# Patient Record
Sex: Female | Born: 1980 | Race: White | Hispanic: No | Marital: Single | Smoking: Former smoker
Health system: Southern US, Community
[De-identification: ages and names within clinical notes are randomized; demographics above are authoritative.]

## PROBLEM LIST (undated history)

## (undated) DIAGNOSIS — I10 Essential (primary) hypertension: Secondary | ICD-10-CM

## (undated) HISTORY — PX: TONSILLECTOMY: SUR1361

---

## 2005-02-19 ENCOUNTER — Emergency Department: Payer: Self-pay | Admitting: Emergency Medicine

## 2005-08-22 ENCOUNTER — Emergency Department: Payer: Self-pay | Admitting: Emergency Medicine

## 2005-09-11 ENCOUNTER — Emergency Department: Payer: Self-pay | Admitting: General Practice

## 2007-05-01 ENCOUNTER — Emergency Department: Payer: Self-pay | Admitting: Emergency Medicine

## 2007-09-11 ENCOUNTER — Emergency Department: Payer: Self-pay | Admitting: Emergency Medicine

## 2007-12-16 ENCOUNTER — Emergency Department: Payer: Self-pay | Admitting: Emergency Medicine

## 2008-11-19 ENCOUNTER — Emergency Department: Payer: Self-pay | Admitting: Emergency Medicine

## 2009-05-19 ENCOUNTER — Emergency Department: Payer: Self-pay

## 2009-06-23 ENCOUNTER — Encounter: Payer: Self-pay | Admitting: Maternal & Fetal Medicine

## 2009-07-07 ENCOUNTER — Encounter: Payer: Self-pay | Admitting: Maternal and Fetal Medicine

## 2009-07-21 ENCOUNTER — Encounter: Payer: Self-pay | Admitting: Obstetrics and Gynecology

## 2009-07-31 ENCOUNTER — Encounter: Payer: Self-pay | Admitting: Maternal & Fetal Medicine

## 2009-08-11 ENCOUNTER — Encounter: Payer: Self-pay | Admitting: Obstetrics and Gynecology

## 2009-08-21 ENCOUNTER — Encounter: Payer: Self-pay | Admitting: Obstetrics & Gynecology

## 2009-08-28 ENCOUNTER — Encounter: Payer: Self-pay | Admitting: Obstetrics and Gynecology

## 2009-08-31 ENCOUNTER — Encounter: Payer: Self-pay | Admitting: Obstetrics and Gynecology

## 2009-09-04 ENCOUNTER — Encounter: Payer: Self-pay | Admitting: Maternal & Fetal Medicine

## 2009-09-15 ENCOUNTER — Inpatient Hospital Stay: Payer: Self-pay

## 2009-11-07 ENCOUNTER — Ambulatory Visit: Payer: Self-pay

## 2009-11-13 ENCOUNTER — Ambulatory Visit: Payer: Self-pay

## 2010-03-25 IMAGING — US US OB DETAIL+14 WK - NRPT [facility]
1 series · 14 of 28 positions shown · non-contrast
Comparison: none

[Series 1: us ob detail+14 wk - nrpt [facility] · 14 of 68 slices shown]
[im 3/68]
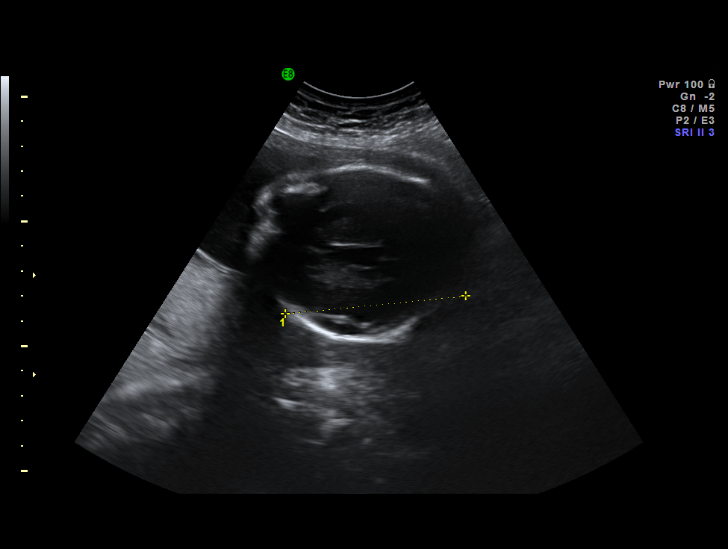
[im 8/68]
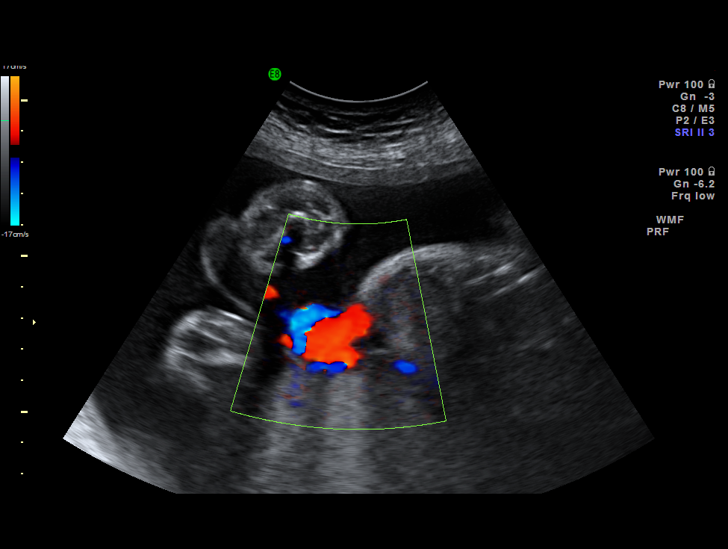
[im 13/68]
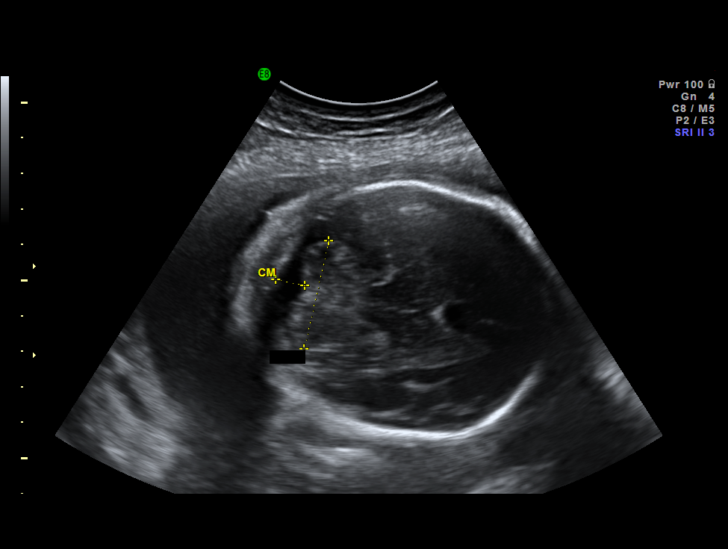
[im 18/68]
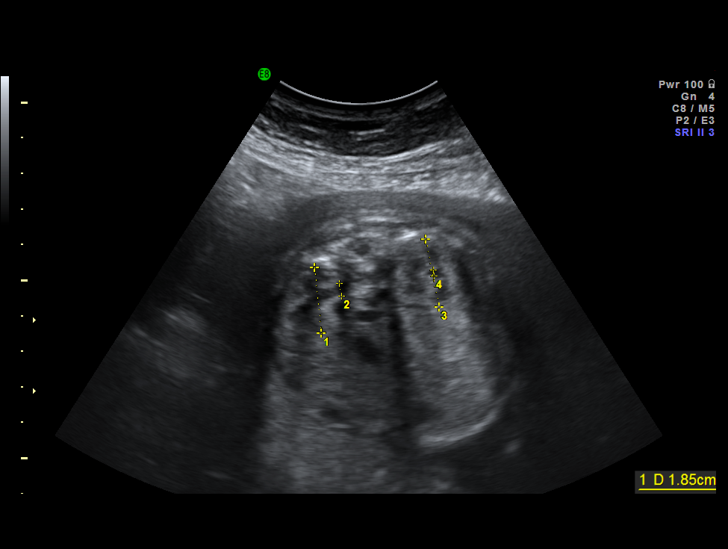
[im 23/68]
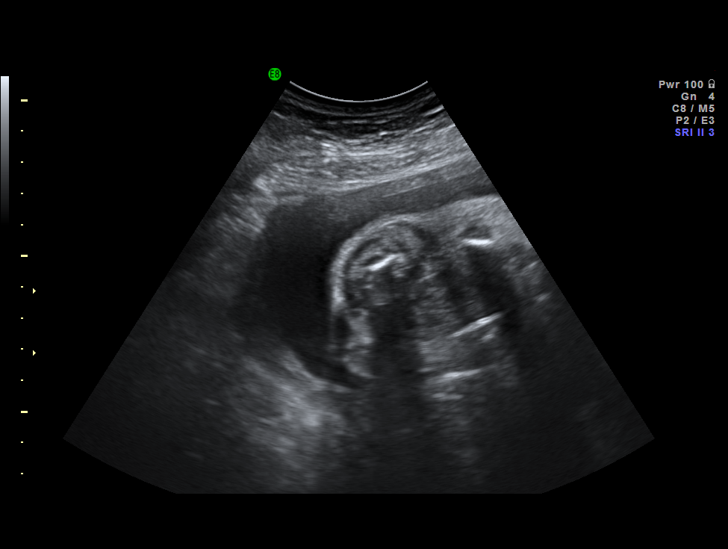
[im 28/68]
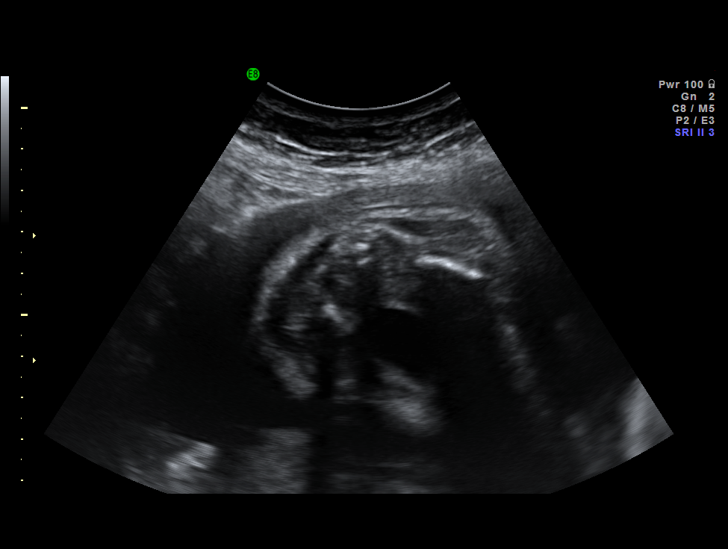
[im 33/68]
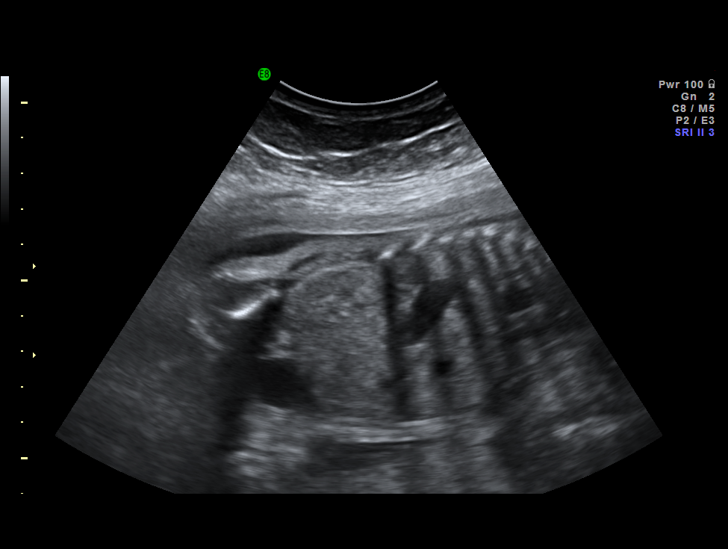
[im 38/68]
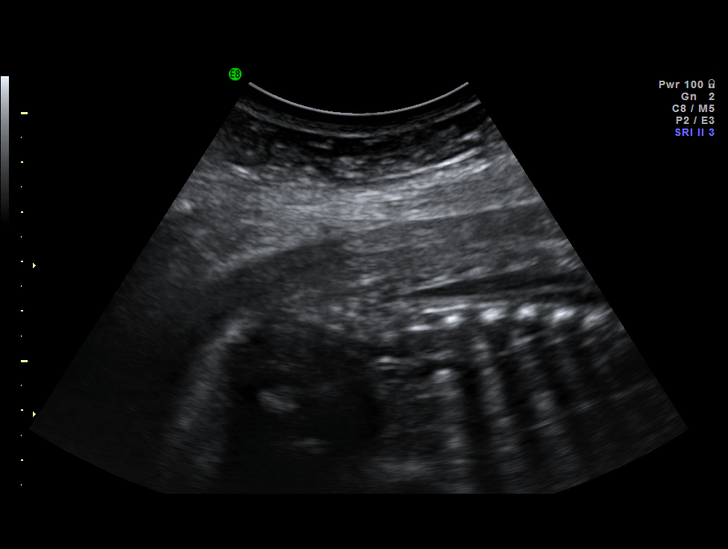
[im 43/68]
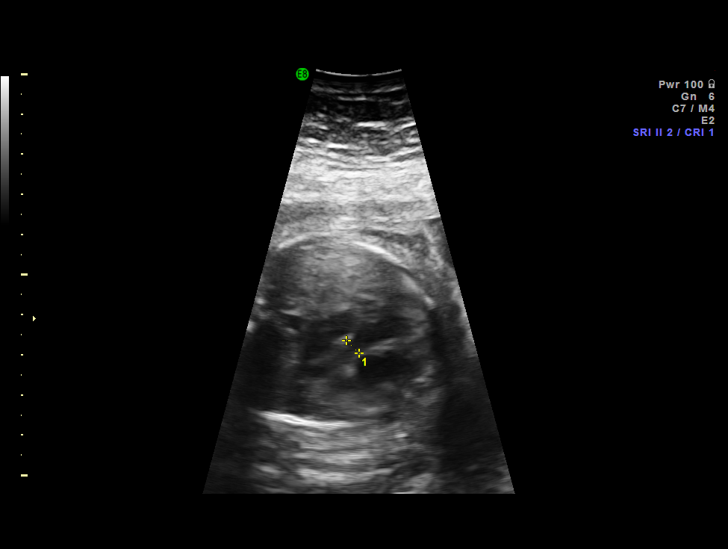
[im 48/68]
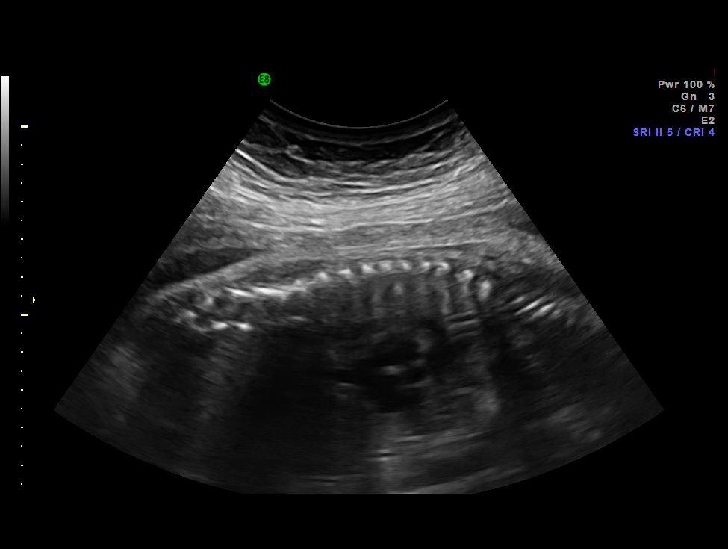
[im 53/68]
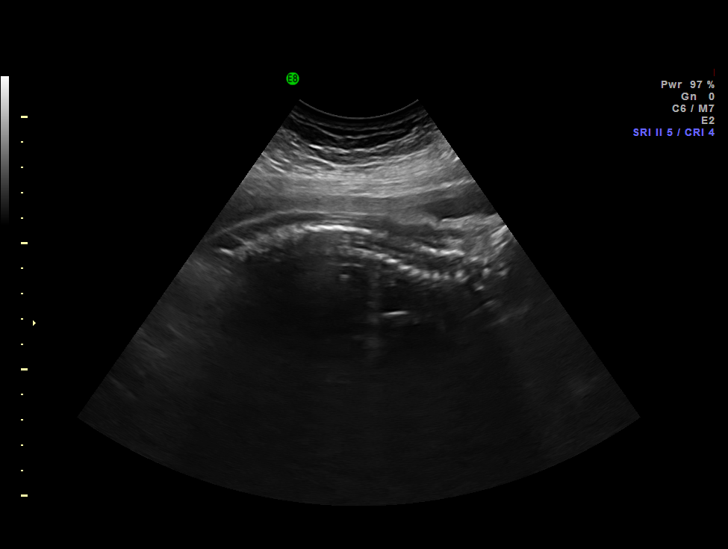
[im 58/68]
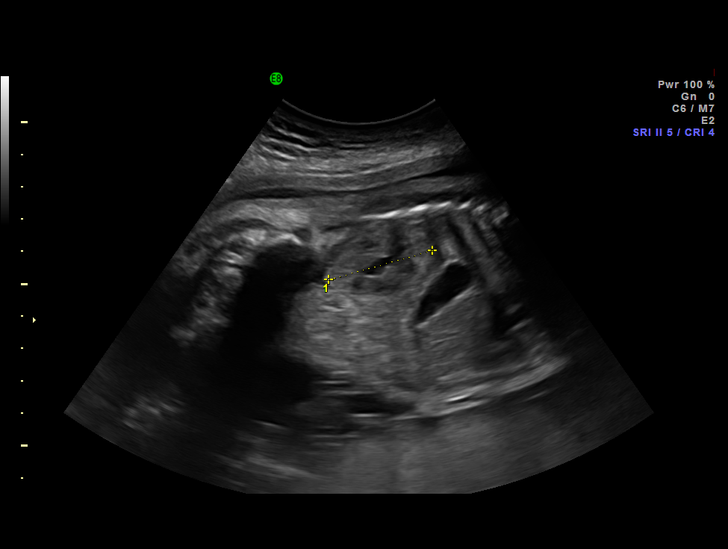
[im 63/68]
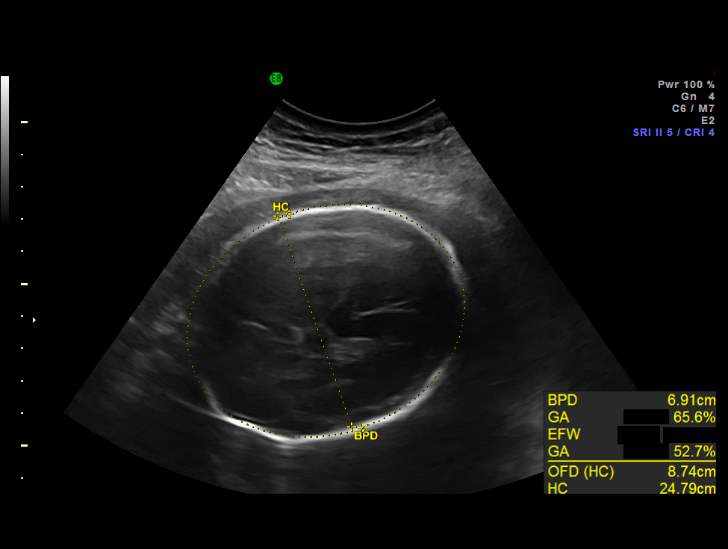
[im 68/68]
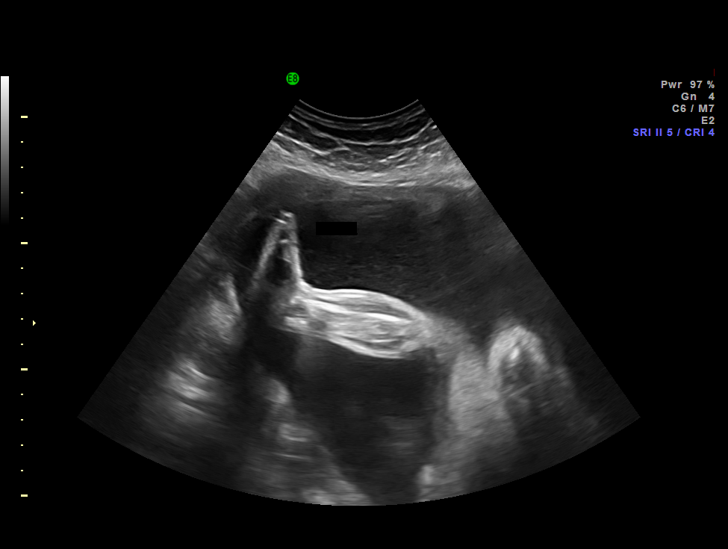

[14 of 28 positions shown; findings below may reference images not displayed]

IMAGES IMPORTED FROM THE [employer] WORKFLOW SYSTEM
NO DICTATION FOR STUDY

## 2010-04-08 IMAGING — US ULTRAOUND OB LIMITED - [facility]PT [facility]
1 series · 11 of 11 positions shown · non-contrast
Comparison: none

[Series 1: ultraound ob limited - nrpt [facility] · 11 of 11 slices shown]
[im 1/11]
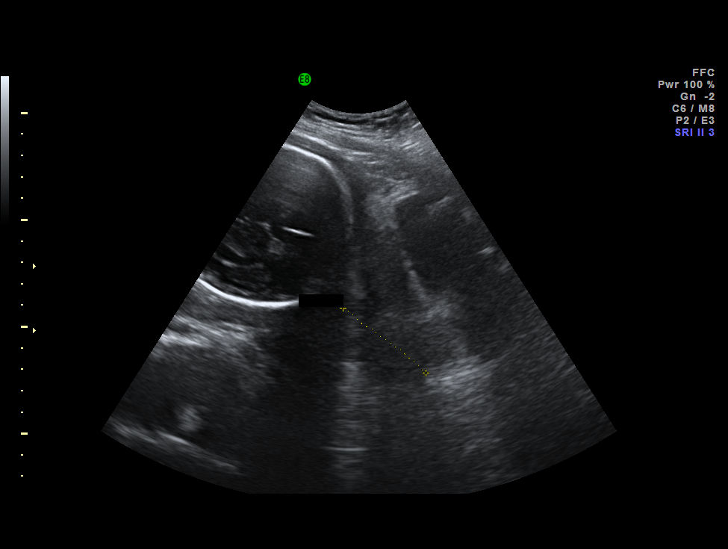
[im 2/11]
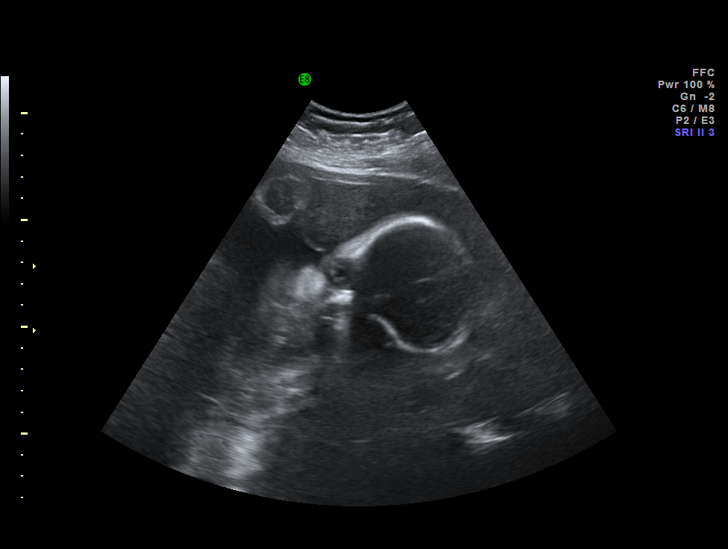
[im 3/11]
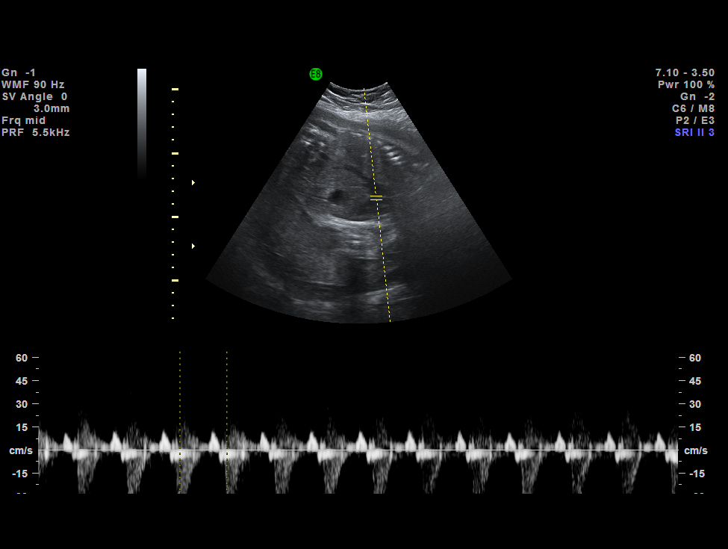
[im 4/11]
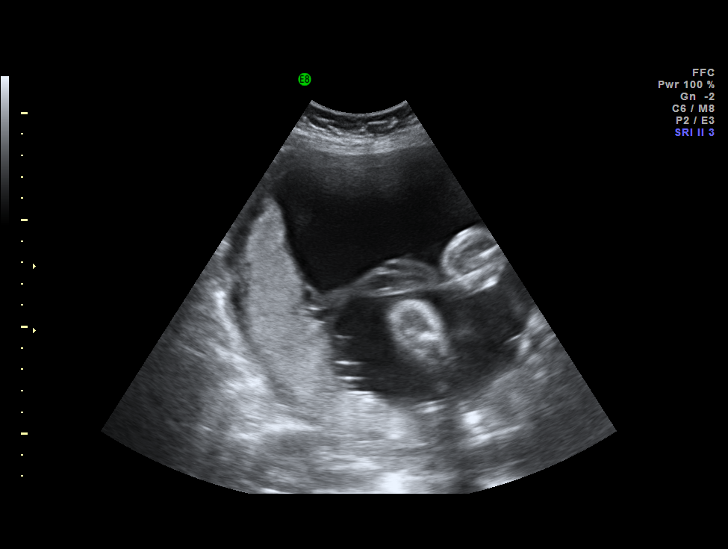
[im 5/11]
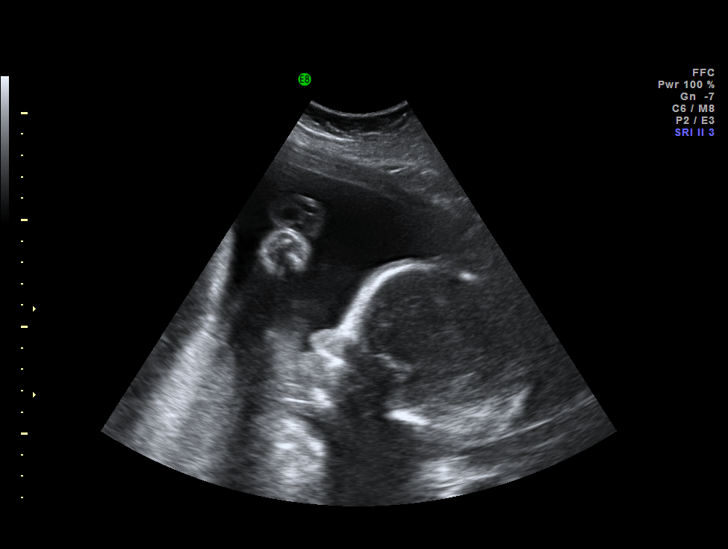
[im 6/11]
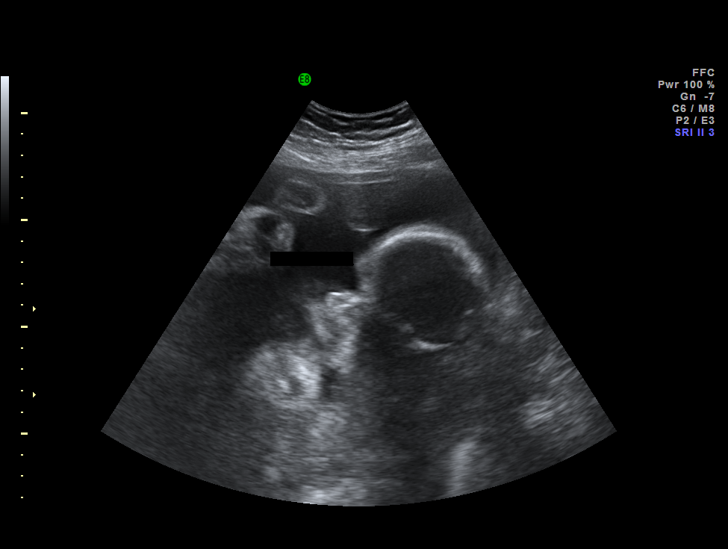
[im 7/11]
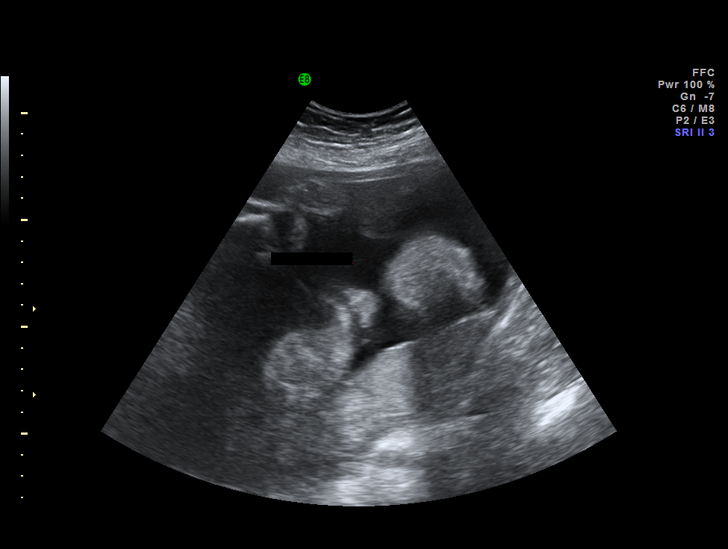
[im 8/11]
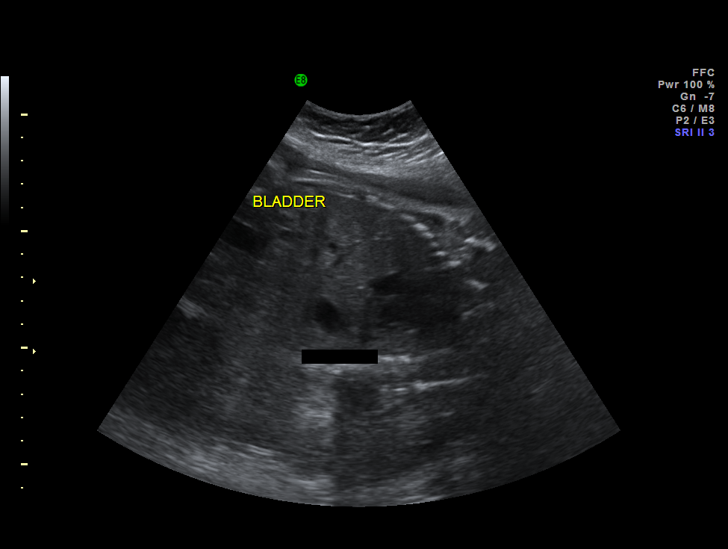
[im 9/11]
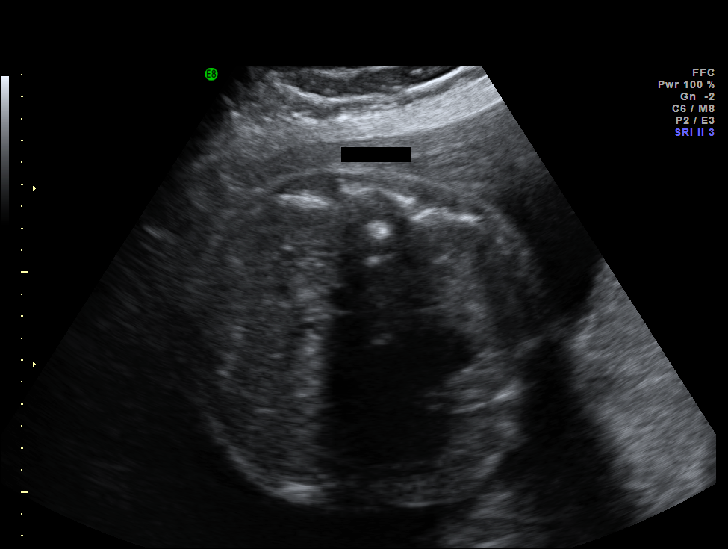
[im 10/11]
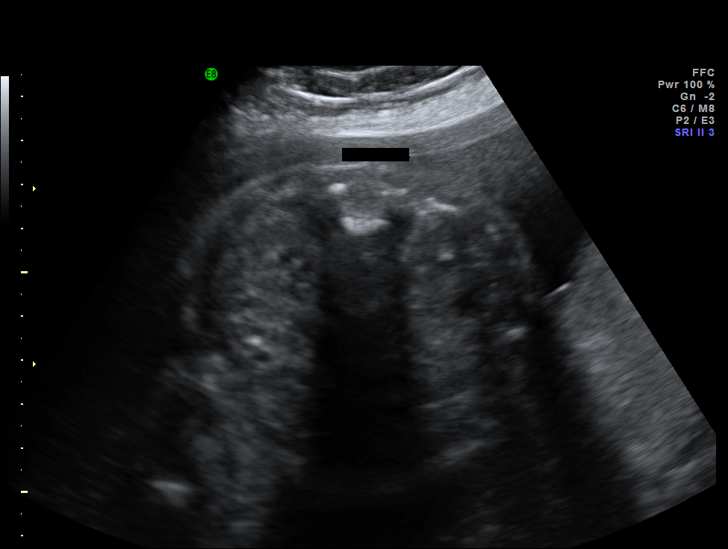
[im 11/11]
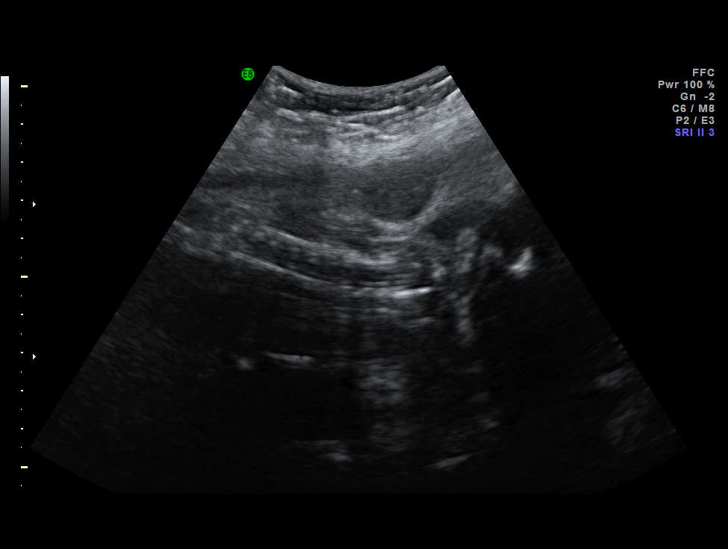

[11 of 11 positions shown; findings below may reference images not displayed]

IMAGES IMPORTED FROM THE [employer] WORKFLOW SYSTEM
NO DICTATION FOR STUDY

## 2010-05-23 IMAGING — US US OB FOLLOW-UP - [facility]
1 series · 14 of 28 positions shown · non-contrast
Comparison: none

[Series 1: us ob follow-up - [facility]pt [facility] · 14 of 42 slices shown]
[im 2/42]
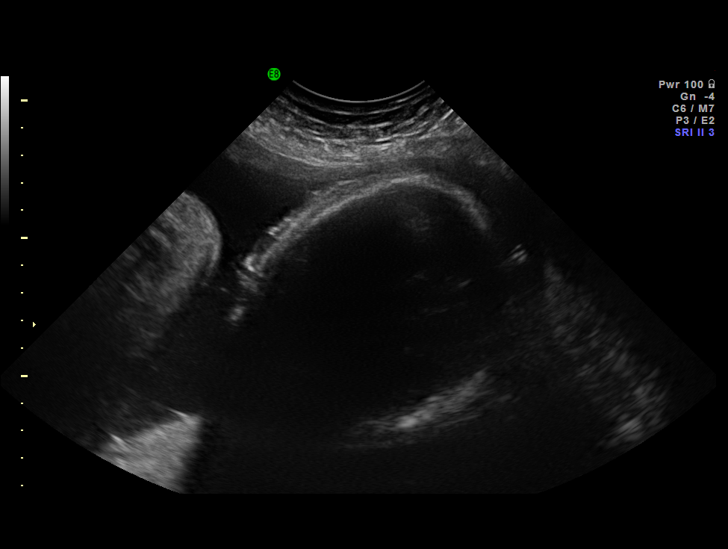
[im 5/42]
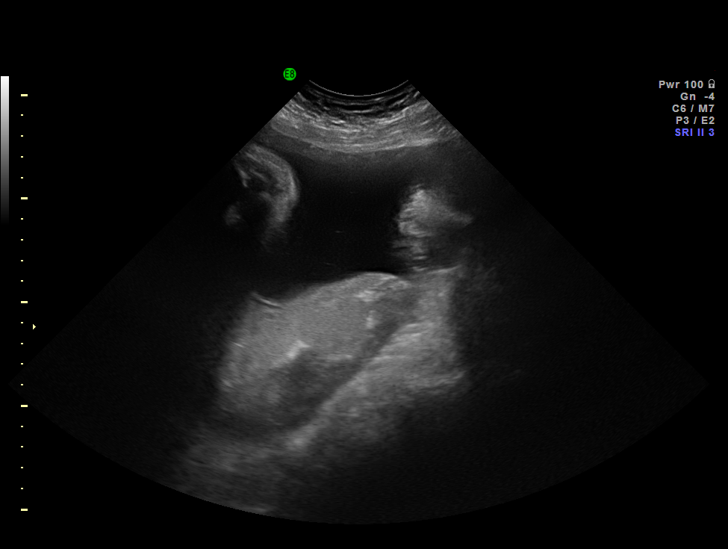
[im 8/42]
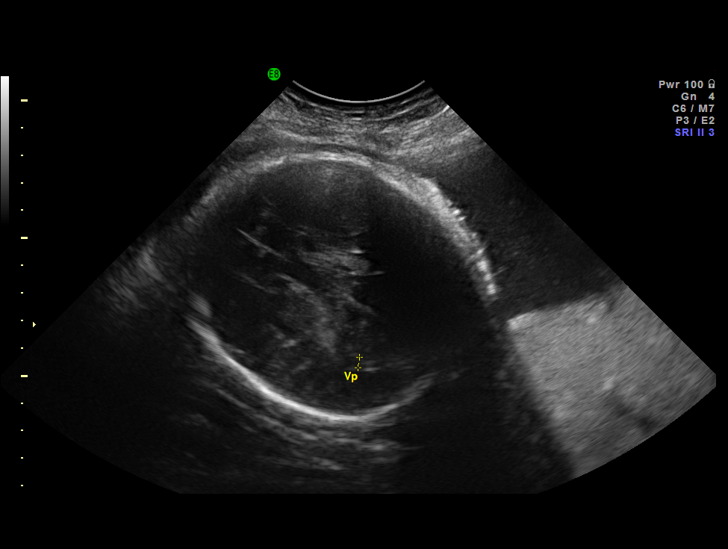
[im 11/42]
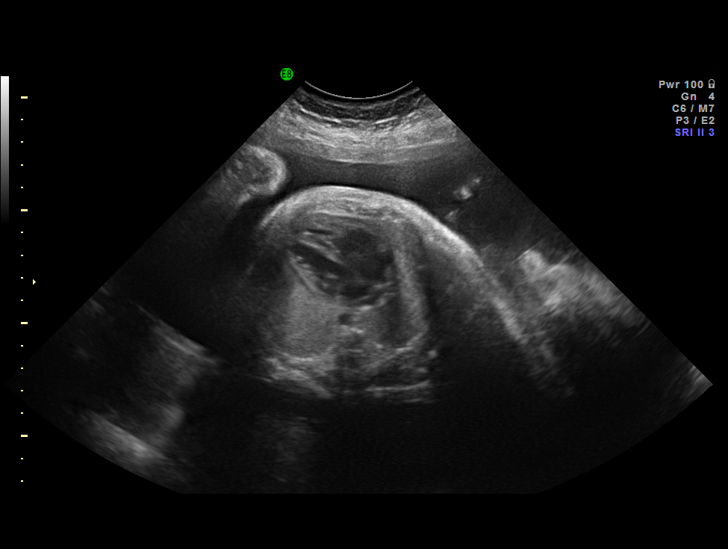
[im 14/42]
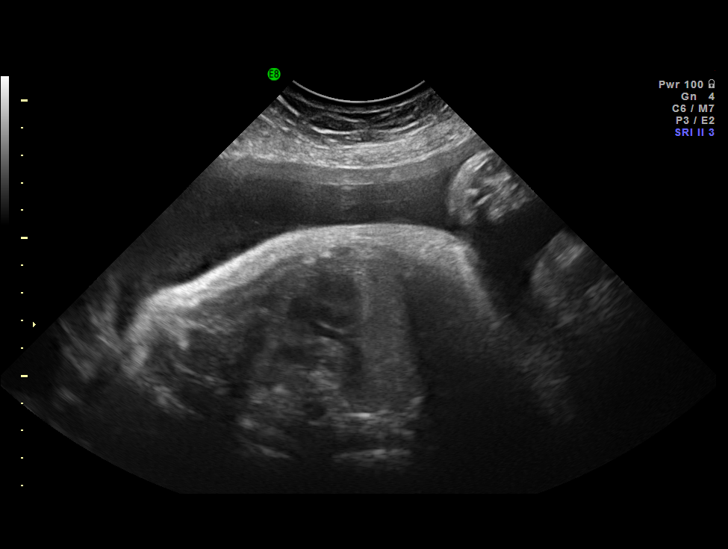
[im 17/42]
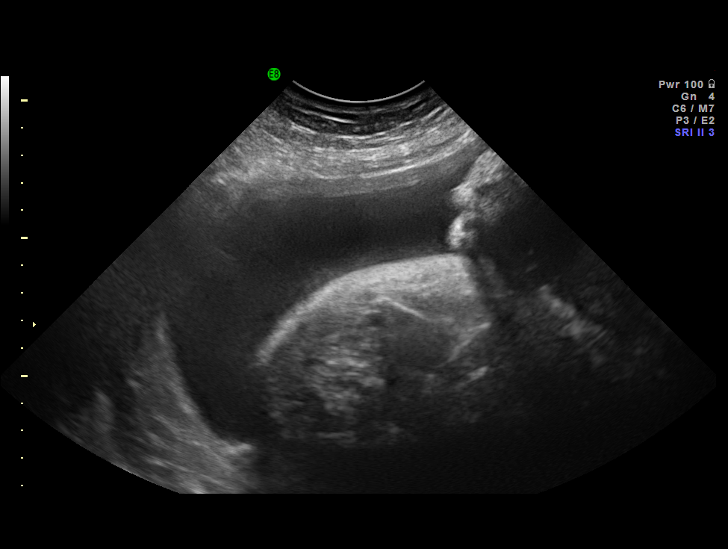
[im 20/42]
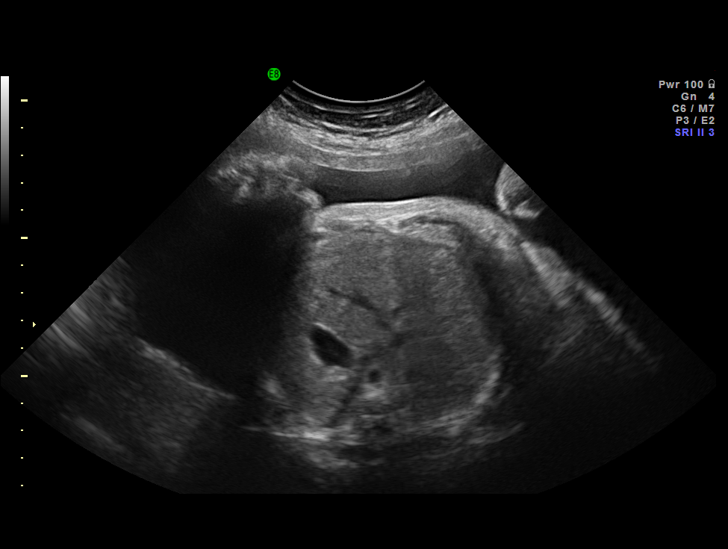
[im 23/42]
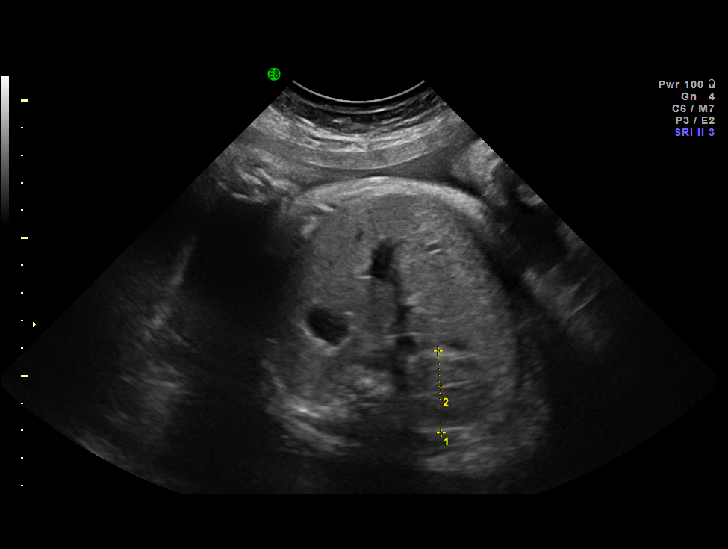
[im 26/42]
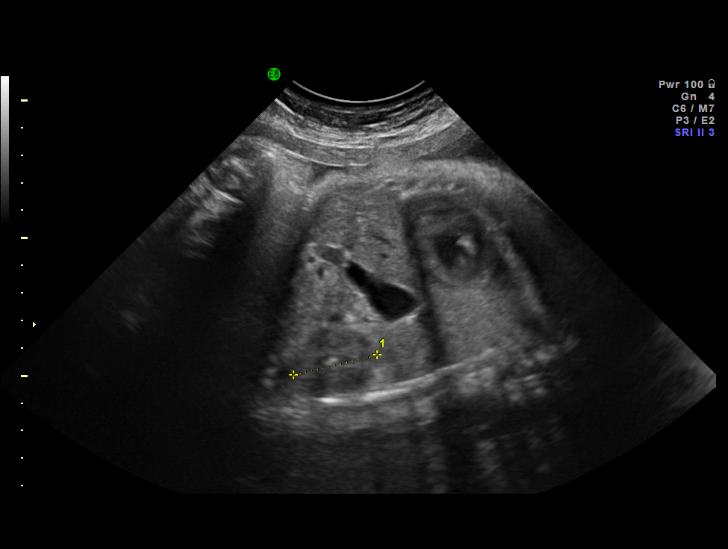
[im 29/42]
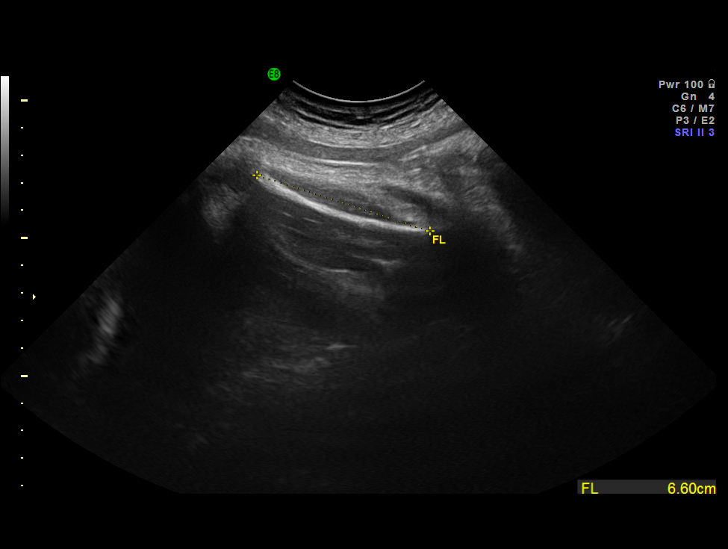
[im 32/42]
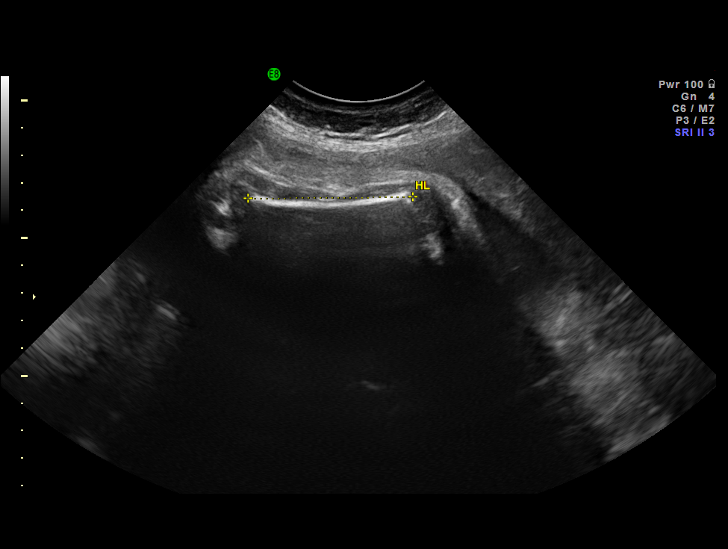
[im 35/42]
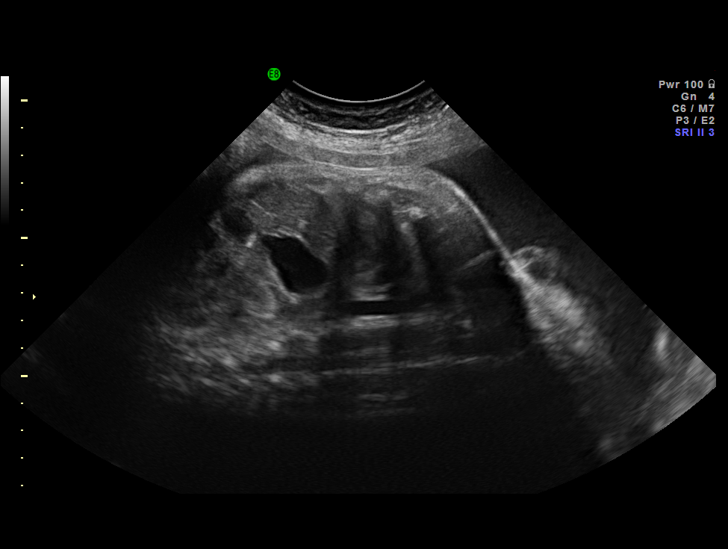
[im 38/42]
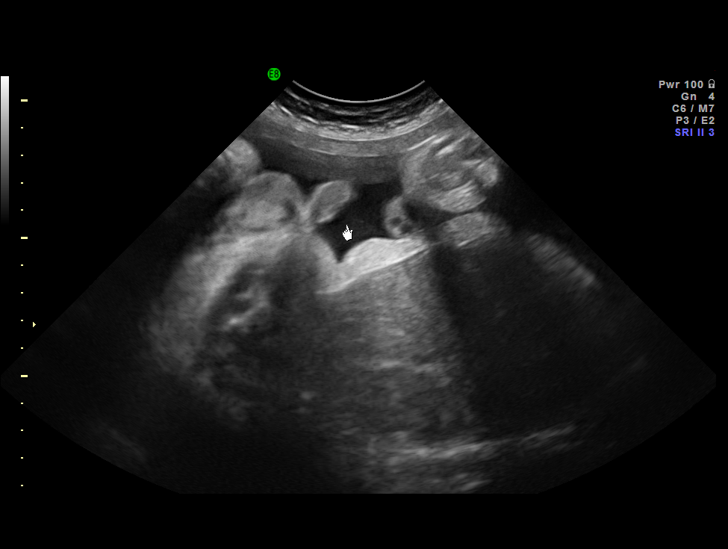
[im 42/42]
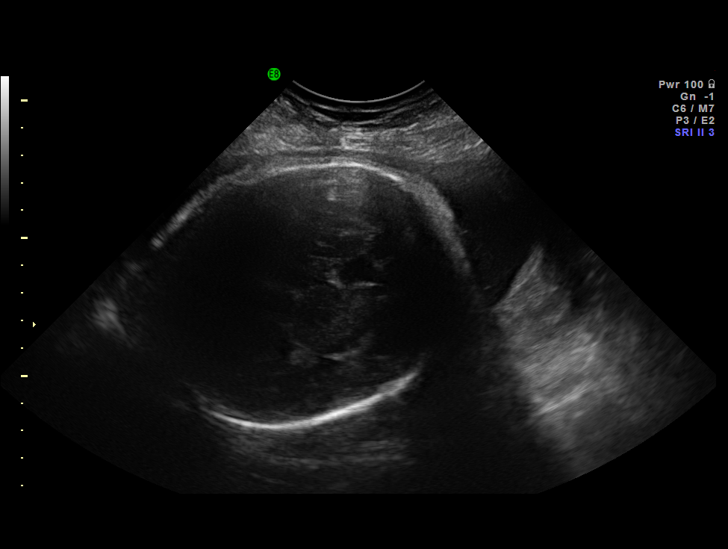

[14 of 28 positions shown; findings below may reference images not displayed]

IMAGES IMPORTED FROM THE [employer] WORKFLOW SYSTEM
NO DICTATION FOR STUDY

## 2010-05-30 IMAGING — US US FETAL BPP W/O NON-STRESS - NRPT
1 series · 10 of 10 positions shown · non-contrast
Comparison: none

[Series 1: us fetal bpp w/o non-stress - nrpt · 0.33mm/px · 10 of 10 slices shown]
[im 1/10]
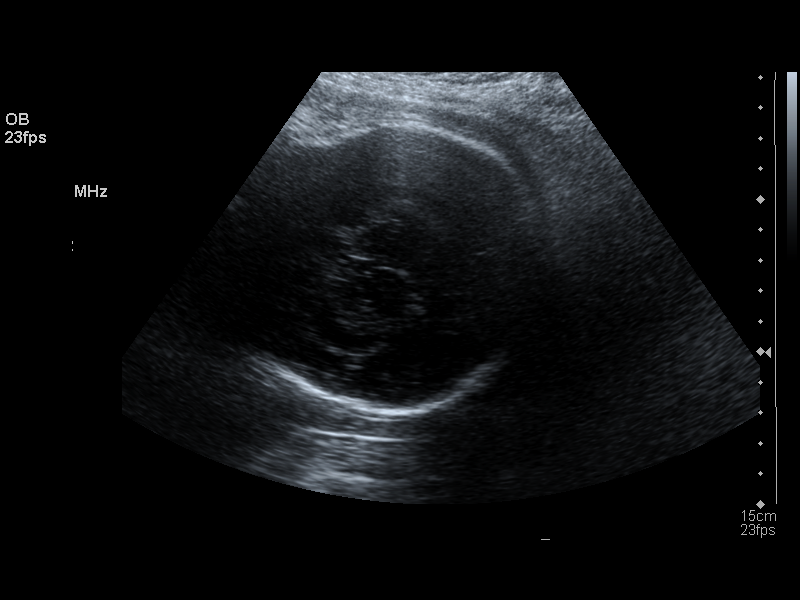
[im 2/10]
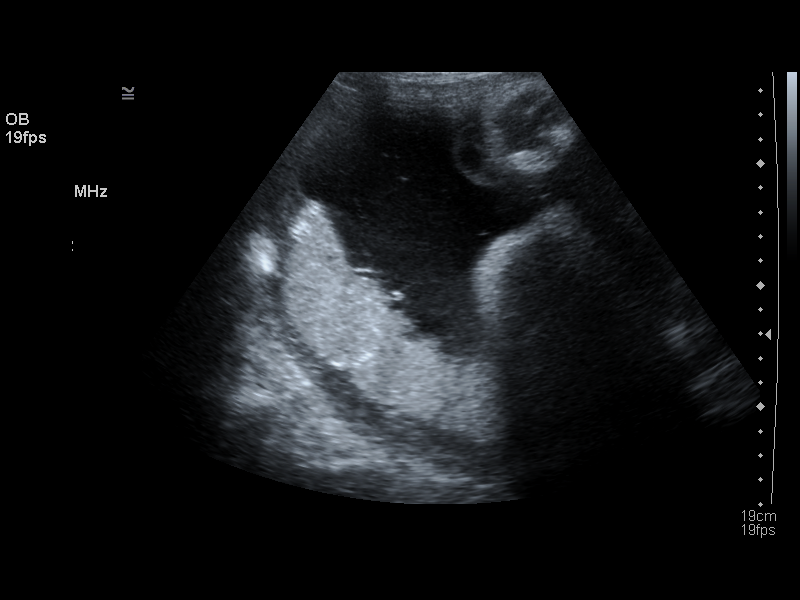
[im 3/10]
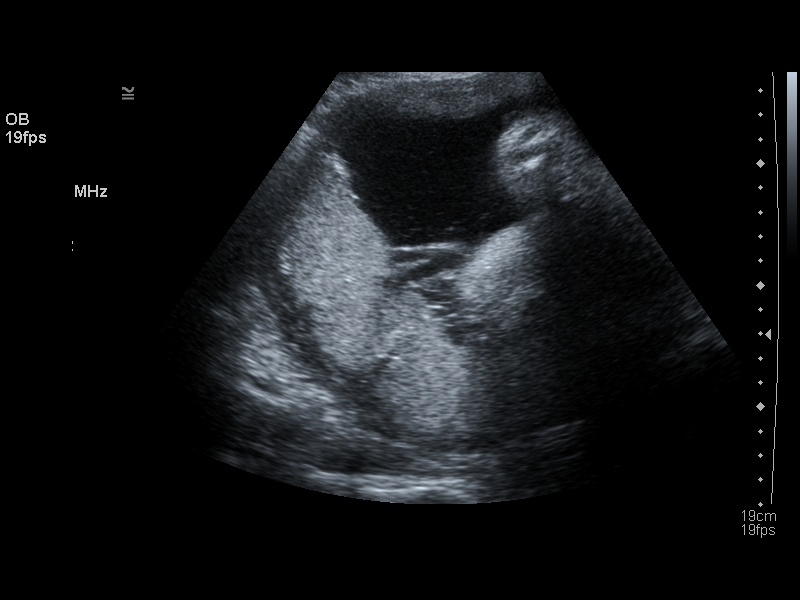
[im 4/10]
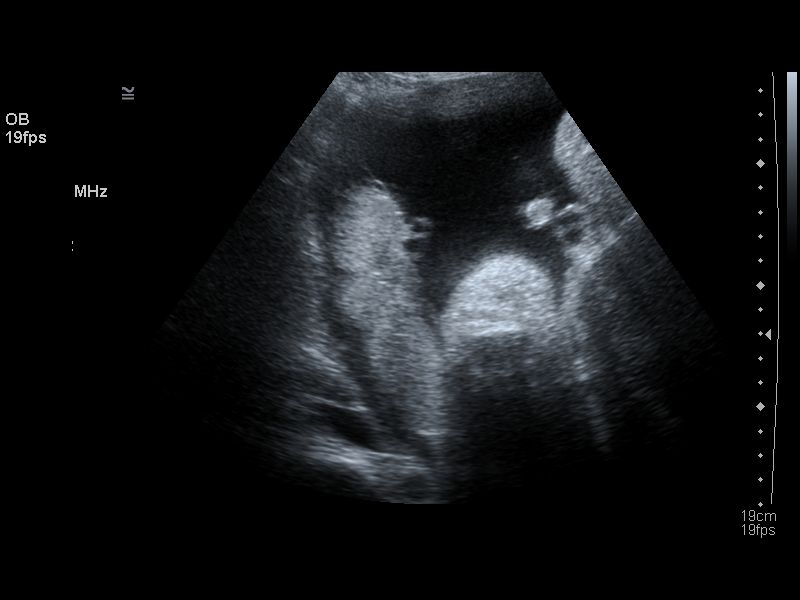
[im 5/10]
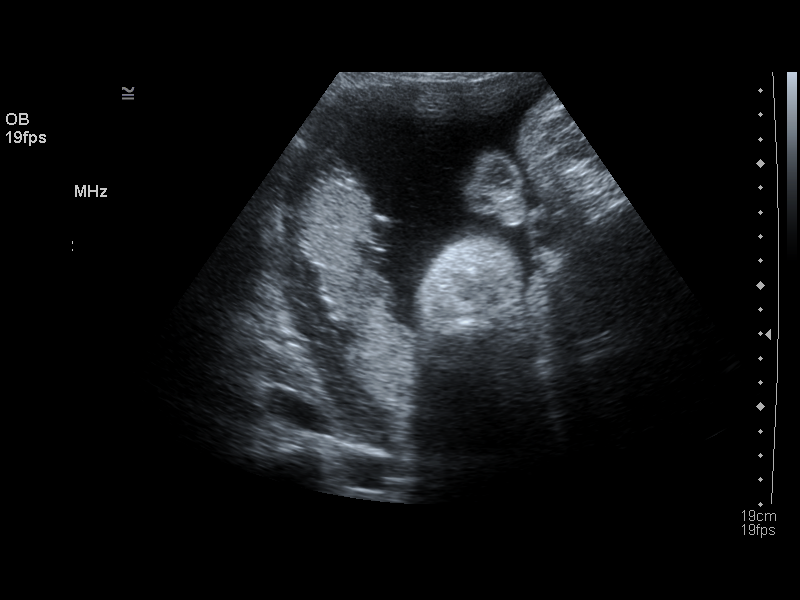
[im 6/10]
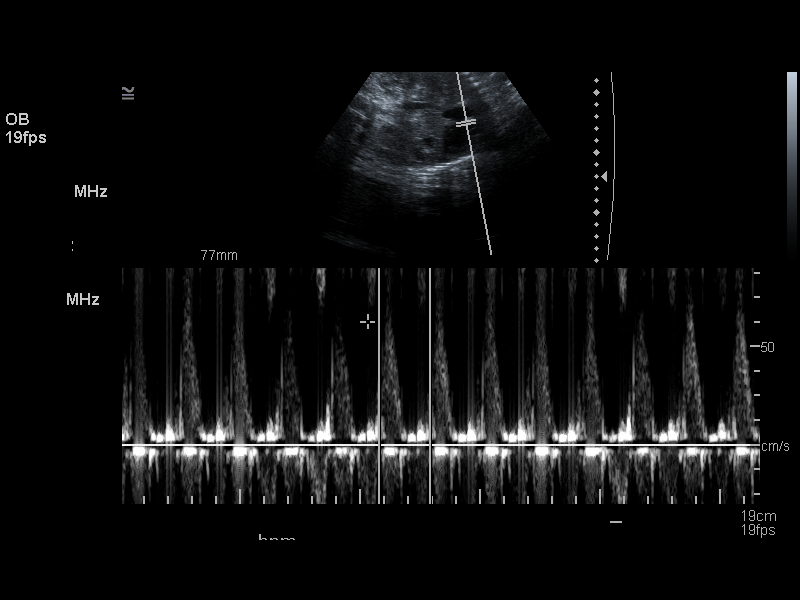
[im 7/10]
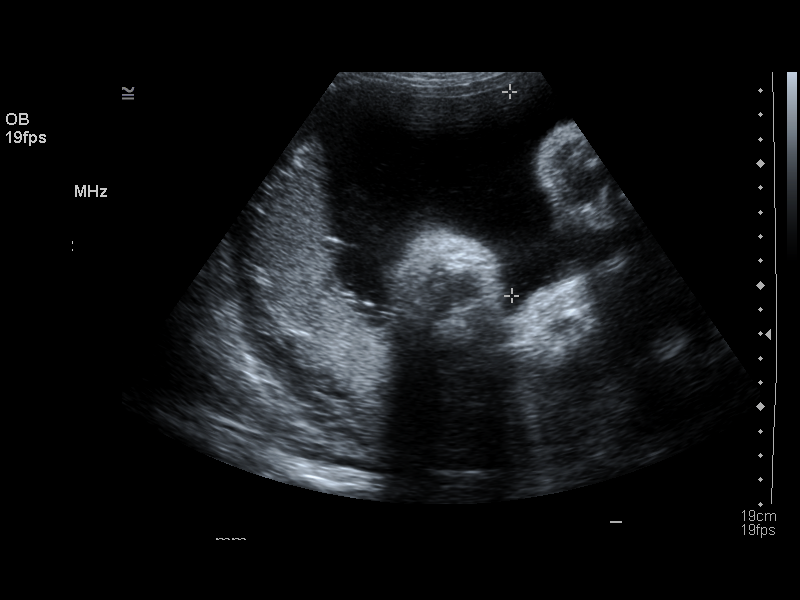
[im 8/10]
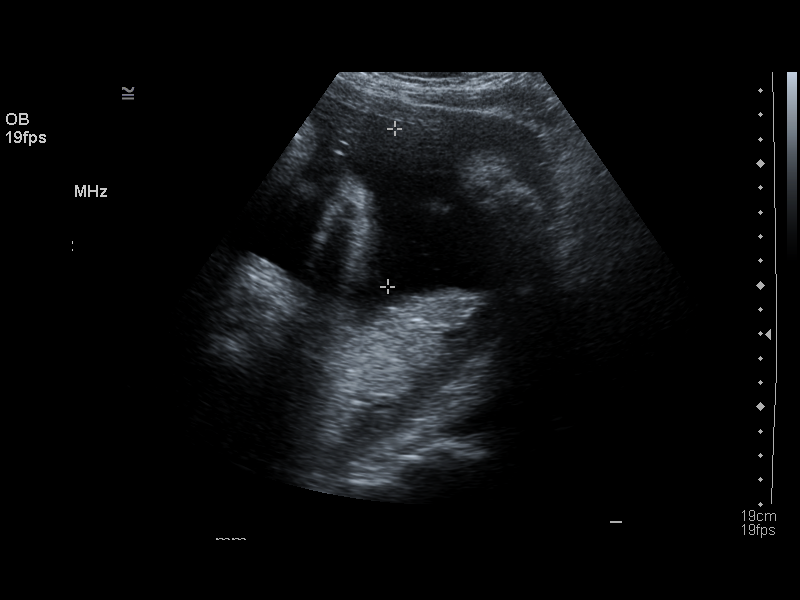
[im 9/10]
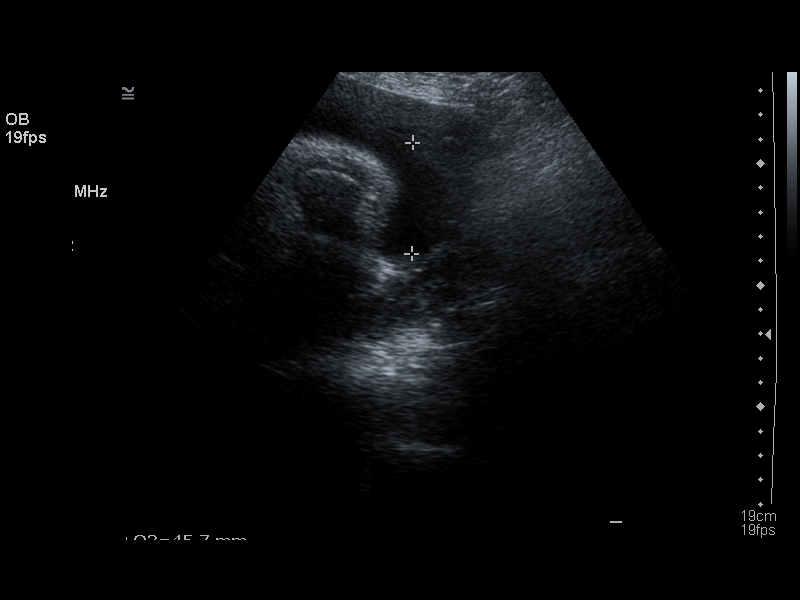
[im 10/10]
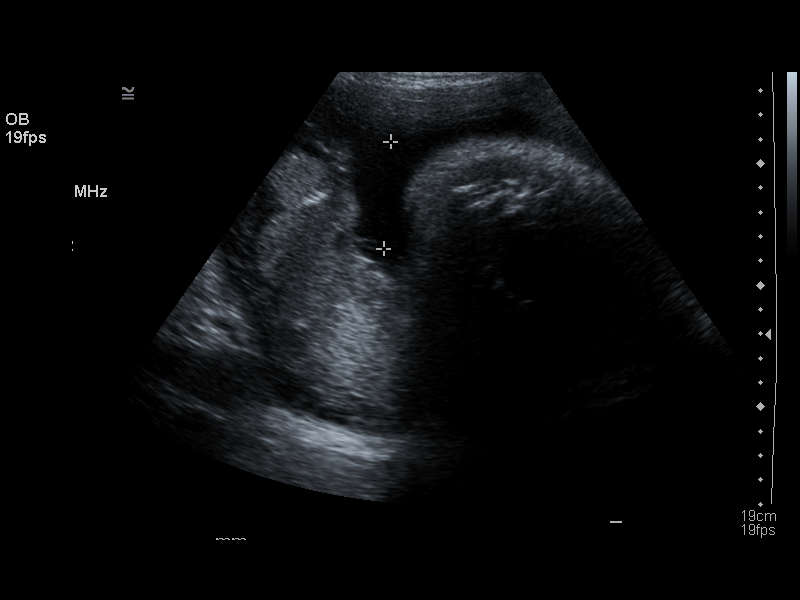

[10 of 10 positions shown; findings below may reference images not displayed]

IMAGES IMPORTED FROM THE SYNGO WORKFLOW SYSTEM
NO DICTATION FOR STUDY

## 2013-08-20 ENCOUNTER — Emergency Department: Payer: Self-pay | Admitting: Emergency Medicine

## 2021-11-23 ENCOUNTER — Ambulatory Visit: Payer: Medicaid Other

## 2021-12-15 ENCOUNTER — Emergency Department
Admission: EM | Admit: 2021-12-15 | Discharge: 2021-12-15 | Disposition: A | Discharge status: Home / Self Care | Payer: Self-pay | Attending: Emergency Medicine | Admitting: Emergency Medicine

## 2021-12-15 ENCOUNTER — Emergency Department: Payer: Self-pay

## 2021-12-15 ENCOUNTER — Encounter: Payer: Self-pay | Admitting: Emergency Medicine

## 2021-12-15 ENCOUNTER — Other Ambulatory Visit: Payer: Self-pay

## 2021-12-15 DIAGNOSIS — M5416 Radiculopathy, lumbar region: Secondary | ICD-10-CM | POA: Insufficient documentation

## 2021-12-15 LAB — URINALYSIS, ROUTINE W REFLEX MICROSCOPIC
Bilirubin Urine: NEGATIVE
Glucose, UA: NEGATIVE mg/dL
Hgb urine dipstick: NEGATIVE
Ketones, ur: NEGATIVE mg/dL
Nitrite: NEGATIVE
Protein, ur: 30 mg/dL — AB
Specific Gravity, Urine: 1.025 (ref 1.005–1.030)
pH: 6 (ref 5.0–8.0)

## 2021-12-15 LAB — POC URINE PREG, ED: Preg Test, Ur: NEGATIVE

## 2021-12-15 MED ORDER — LIDOCAINE 5 % EX PTCH
1.0000 | MEDICATED_PATCH | CUTANEOUS | Status: DC
Start: 2021-12-15 — End: 2021-12-15
  Administered 2021-12-15: 1 via TRANSDERMAL
  Filled 2021-12-15: qty 1

## 2021-12-15 MED ORDER — NAPROXEN 500 MG PO TABS: 500.0000 mg | ORAL_TABLET | Freq: Two times a day (BID) | ORAL | 0 refills | Status: AC

## 2021-12-15 MED ORDER — DEXAMETHASONE SODIUM PHOSPHATE 10 MG/ML IJ SOLN
10.0000 mg | Freq: Once | INTRAMUSCULAR | Status: AC
  Administered 2021-12-15: 10 mg via INTRAMUSCULAR
  Filled 2021-12-15: qty 1

## 2021-12-15 MED ORDER — ACETAMINOPHEN 325 MG PO TABS
650.0000 mg | ORAL_TABLET | Freq: Once | ORAL | Status: AC
  Administered 2021-12-15: 650 mg via ORAL
  Filled 2021-12-15: qty 2

## 2021-12-15 MED ORDER — PREDNISONE 10 MG (21) PO TBPK: ORAL_TABLET | ORAL | 0 refills | Status: DC

## 2021-12-15 MED ORDER — KETOROLAC TROMETHAMINE 15 MG/ML IJ SOLN
15.0000 mg | Freq: Once | INTRAMUSCULAR | Status: AC
  Administered 2021-12-15: 15 mg via INTRAMUSCULAR
  Filled 2021-12-15: qty 1

## 2021-12-15 NOTE — ED Notes (Signed)
Pt gives verbal consent to Dc ? ?

## 2021-12-15 NOTE — ED Triage Notes (Signed)
Back pain --low and rad down left leg since Saturday.  No injury

## 2021-12-15 NOTE — ED Provider Triage Note (Signed)
  Emergency Medicine Provider Triage Evaluation Note  Sheri Mendoza , a 41 y.o.female,  was evaluated in triage.  Pt complains of left lower extremity pain/numbness.  Patient states that she believes that she has sciatica.  She has not had these symptoms before.  She states that it hit her suddenly last night while she was sleeping.  Denies any recent injuries or illnesses.   Review of Systems  Positive: Pain in the buttock/hip/left lower extremity. Negative: Denies fever, chest pain, vomiting  Physical Exam  There were no vitals filed for this visit. Gen:   Awake, no distress   Resp:  Normal effort  MSK:   Moves extremities without difficulty  Other:    Medical Decision Making  Given the patient's initial medical screening exam, the following diagnostic evaluation has been ordered. The patient will be placed in the appropriate treatment space, once one is available, to complete the evaluation and treatment. I have discussed the plan of care with the patient and I have advised the patient that an ED physician or mid-level practitioner will reevaluate their condition after the test results have been received, as the results may give them additional insight into the type of treatment they may need.    Diagnostics: Lumbar x-ray  Treatments: none immediately   Varney Daily, [place] 12/15/21 1543

## 2021-12-15 NOTE — ED Provider Notes (Signed)
[facility] Provider Note    Event Date/Time   First MD Initiated Contact with Patient 12/15/21 1600     (approximate)   History   Back Pain   HPI  Sheri Mendoza is a 41 y.o. female who presents today for evaluation of low back pain with radiation down her left leg x2-3 days.  Patient reports that her pain began on Saturday.  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  She reports that her pain began after a long shift.  She denies weakness in her leg.  She denies paresthesias in her leg.  She denies urinary or fecal incontinence or retention.  She reports that the pain radiates down her left buttock, down the lateral aspect of her left leg, and occasionally into her toes.  She denies any urinary or fecal incontinence or retention.  She denies fevers or chills.  She denies any history of IV drug use.  She denies abdominal pain or urinary symptoms.  There are no problems to display for this patient.         Physical Exam   Triage Vital Signs: ED Triage Vitals  Enc Vitals Group     BP 12/15/21 1555 (!) 186/104     Pulse --      Resp --      Temp --      Temp src --      SpO2 --      Weight 12/15/21 1546 180 lb (81.6 kg)     Height 12/15/21 1546 5\' 3"  (1.6 m)     Head Circumference --      Peak Flow --      Pain Score 12/15/21 1546 10     Pain Loc --      Pain Edu? --      Excl. in GC? --     Most recent vital signs: Vitals:   12/15/21 1628 12/15/21 1800  BP: (!) 178/99 (!) 168/76  Pulse: 78 74  Resp:  17  Temp: 98 F (36.7 C) 98.2 F (36.8 C)  SpO2: 99% 98%    Physical Exam Vitals and nursing note reviewed.  Constitutional:      General: Awake and alert. No acute distress.    Appearance: Normal appearance. The patient is overweight.  HENT:     Head: Normocephalic and atraumatic.     Mouth: Mucous membranes are moist.  Eyes:     General: PERRL. Normal  EOMs        Right eye: No discharge.        Left eye: No discharge.     Conjunctiva/sclera: Conjunctivae normal.  Cardiovascular:     Rate and Rhythm: Normal rate and regular rhythm.     Pulses: Normal pulses.     Heart sounds: Normal heart sounds Pulmonary:     Effort: Pulmonary effort is normal. No respiratory distress.     Breath sounds: Normal breath sounds.  Abdominal:     Abdomen is soft. There is no abdominal tenderness. No rebound or guarding. No distention. No CVAT Back: No midline tenderness. Strength and sensation 5/5 to bilateral lower extremities. Normal great toe extension against resistance. Normal sensation throughout feet. Normal patellar reflexes. Positive SLR on the left, and negative opposite SLR bilaterally. Musculoskeletal:        General: No swelling. Normal range of motion.     Cervical back:  Normal range of motion and neck supple.  No lower extremity swelling Skin:    General: Skin is warm and dry.     Capillary Refill: Capillary refill takes less than 2 seconds.     Findings: No rash.  Neurological:     Mental Status: The patient is awake and alert.      ED Results / Procedures / Treatments   Labs (all labs ordered are listed, but only abnormal results are displayed) Labs Reviewed  URINALYSIS, ROUTINE W REFLEX MICROSCOPIC - Abnormal; Notable for the following components:      Result Value   Color, Urine YELLOW (*)    APPearance HAZY (*)    Protein, ur 30 (*)    Leukocytes,Ua TRACE (*)    Bacteria, UA RARE (*)    All other components within normal limits  POC URINE PREG, ED     EKG     RADIOLOGY I independently reviewed and interpreted imaging and agree with radiologists findings.     PROCEDURES:  Critical Care performed:   Procedures   MEDICATIONS ORDERED IN ED: Medications  lidocaine (LIDODERM) 5 % 1 patch (1 patch Transdermal Patch Applied 12/15/21 1644)  ketorolac (TORADOL) 15 MG/ML injection 15 mg (15 mg Intramuscular Given  12/15/21 1643)  acetaminophen (TYLENOL) tablet 650 mg (650 mg Oral Given 12/15/21 1643)  dexamethasone (DECADRON) injection 10 mg (10 mg Intramuscular Given 12/15/21 1724)     IMPRESSION / MDM / ASSESSMENT AND PLAN / ED COURSE  I reviewed the triage vital signs and the nursing notes.   Differential diagnosis includes, but is not limited to, lumbar radiculopathy, UTI, pyelonephritis, kidney stone, back spasm.  She has 5 out of 5 strength with intact sensation to extensor hallucis dorsiflexion and plantarflexion of bilateral lower extremities with normal patellar reflexes bilaterally. Most likely etiology at this point is muscle strain vs herniated disc. No Mendoza flags to indicate patient is at risk for more auspicious process that would require urgent/emergent spinal imaging or subspecialty evaluation at this time. No major trauma, no midline tenderness, no history or physical exam findings to suggest cauda equina syndrome or spinal cord compression. No focal neurological deficits on exam. No constitutional symptoms or history of immunosuppression or IVDA to suggest potential for epidural abscess. Not anticoagulated, no history of bleeding diastasis to suggest risk for epidural hematoma. No chronic steroid use or advanced age or history of malignancy to suggest proclivity towards pathological fracture.  No abdominal pain or flank pain to suggest kidney stone, no history of kidney stone.  No fever or dysuria or CVAT to suggest pyelonephritis .  No chest pain, back pain, shortness of breath, neurological deficits, to suggest vascular catastrophe, and pulses are equal in all 4 extremities.  X-ray obtained in triage is negative for any acute findings.  Patient was treated symptomatically with significant improvement of her symptoms.  She was started on a prednisone burst for likely lumbar radiculopathy.  She was also started on naproxen.  We discussed that she should not take this with other NSAIDs.  Patient  understands and agrees with plan.  Discussed care instructions and return precautions with patient. Recommended close outpatient follow-up for re-evaluation. Patient agrees with plan of care. Will treat the patient symptomatically as needed for pain control. Will discharge patient to take these medications and return for any worsening or different pain or development of any neurologic symptoms.  She was well evaluated several times with improvement of her symptoms.  She is able to  ambulate with a steady, normal gait.  Educated patient regarding expected time course for back pain to improve and recommended very close outpatient follow-up.  Patient understands and agrees with plan.  She was discharged in stable condition.   Patient's presentation is most consistent with acute illness / injury with system symptoms.   Clinical Course as of 12/15/21 1850  Tue Dec 15, 2021  1806 Patient reports she feels significantly improved and is ready for discharge [JP]    Clinical Course User Index [JP] Micole Delehanty, Herb Grays, PA-C     FINAL CLINICAL IMPRESSION(S) / ED DIAGNOSES   Final diagnoses:  Lumbar radiculopathy     Rx / DC Orders   ED Discharge Orders          Ordered    naproxen (NAPROSYN) 500 MG tablet  2 times daily with meals        12/15/21 1809    predniSONE (STERAPRED UNI-PAK 21 TAB) 10 MG (21) TBPK tablet        12/15/21 1809             Note:  This document was prepared using Dragon voice recognition software and may include unintentional dictation errors.   Jackelyn Hoehn, PA-C 12/15/21 1851    Concha Se, MD 12/16/21 [phone]

## 2021-12-15 NOTE — Discharge Instructions (Addendum)
Take the medications as prescribed.  Do not take any other NSAIDs with the naproxen.  Please return to the emergency department for any new, worsening, or changing symptoms or other concerns including weakness in your legs, urinary or stool incontinence or retention, numbness or tingling in your extremities/buttocks/groin, fevers, or any other concerns or change in symptoms.

## 2022-03-22 ENCOUNTER — Emergency Department
Admission: EM | Admit: 2022-03-22 | Discharge: 2022-03-22 | Disposition: A | Discharge status: Home / Self Care | Payer: Self-pay | Attending: Emergency Medicine | Admitting: Emergency Medicine

## 2022-03-22 ENCOUNTER — Other Ambulatory Visit: Payer: Self-pay

## 2022-03-22 DIAGNOSIS — Z202 Contact with and (suspected) exposure to infections with a predominantly sexual mode of transmission: Secondary | ICD-10-CM | POA: Insufficient documentation

## 2022-03-22 HISTORY — DX: Essential (primary) hypertension: I10

## 2022-03-22 LAB — HIV ANTIBODY (ROUTINE TESTING W REFLEX): HIV Screen 4th Generation wRfx: NONREACTIVE

## 2022-03-22 LAB — CHLAMYDIA/NGC RT PCR (ARMC ONLY)
Chlamydia Tr: NOT DETECTED
N gonorrhoeae: NOT DETECTED

## 2022-03-22 LAB — WET PREP, GENITAL
Sperm: NONE SEEN
Trich, Wet Prep: NONE SEEN
WBC, Wet Prep HPF POC: <10 (ref <10)

## 2022-03-22 MED ORDER — LIDOCAINE HCL (PF) 1 % IJ SOLN
INTRAMUSCULAR | Status: AC
  Administered 2022-03-22: 1 mg via INTRAMUSCULAR
  Filled 2022-03-22: qty 5

## 2022-03-22 MED ORDER — METRONIDAZOLE 500 MG PO TABS
2000.0000 mg | ORAL_TABLET | Freq: Once | ORAL | Status: AC
  Administered 2022-03-22: 2000 mg via ORAL
  Filled 2022-03-22: qty 4

## 2022-03-22 MED ORDER — CEFTRIAXONE SODIUM 1 G IJ SOLR
500.0000 mg | Freq: Once | INTRAMUSCULAR | Status: AC
  Administered 2022-03-22: 500 mg via INTRAMUSCULAR
  Filled 2022-03-22: qty 10

## 2022-03-22 MED ORDER — DOXYCYCLINE MONOHYDRATE 100 MG PO TABS: 100.0000 mg | ORAL_TABLET | Freq: Two times a day (BID) | ORAL | 0 refills | Status: AC

## 2022-03-22 NOTE — ED Provider Notes (Signed)
[facility] Provider Note    Event Date/Time   First MD Initiated Contact with Patient 03/22/22 0827     (approximate)   History   Exposure to STD   HPI  Sheri Mendoza is a 41 y.o. female who presents today for after exposure to sexually transmitted disease.  She reports that she has a new partner, and her new partner was just told by his ex partner that she was positive for trichomonas.  Patient would like to be tested and treated for "everything."  She denies any symptoms, though she reports that she googled the symptoms and after reading about the symptoms she reports that she feels a little bit itchy.  She has not had any vaginal discharge.  No abdominal pain.  No pelvic pain.  No dyspareunia.  No fevers or chills.  There are no problems to display for this patient.         Physical Exam   Triage Vital Signs: ED Triage Vitals  Enc Vitals Group     BP 03/22/22 0807 (!) 218/92     Pulse Rate 03/22/22 0807 96     Resp 03/22/22 0807 18     Temp 03/22/22 0807 98.1 F (36.7 C)     Temp Source 03/22/22 0807 Oral     SpO2 03/22/22 0807 100 %     Weight 03/22/22 0827 179 lb 14.3 oz (81.6 kg)     Height 03/22/22 0827 5\' 3"  (1.6 m)     Head Circumference --      Peak Flow --      Pain Score 03/22/22 0808 0     Pain Loc --      Pain Edu? --      Excl. in GC? --     Most recent vital signs: Vitals:   03/22/22 0807  BP: (!) 218/92  Pulse: 96  Resp: 18  Temp: 98.1 F (36.7 C)  SpO2: 100%    Physical Exam Vitals and nursing note reviewed.  Constitutional:      General: Awake and alert. No acute distress.    Appearance: Normal appearance. The patient is normal weight.  HENT:     Head: Normocephalic and atraumatic.     Mouth: Mucous membranes are moist.  Eyes:     General: PERRL. Normal EOMs        Right eye: No discharge.        Left eye: No discharge.     Conjunctiva/sclera: Conjunctivae normal.  Cardiovascular:     Rate and Rhythm:  Normal rate and regular rhythm.     Pulses: Normal pulses.     Heart sounds: Normal heart sounds Pulmonary:     Effort: Pulmonary effort is normal. No respiratory distress.     Breath sounds: Normal breath sounds.  Abdominal:     Abdomen is soft. There is no abdominal tenderness. No rebound or guarding. No distention. Declined pelvic exam Musculoskeletal:        General: No swelling. Normal range of motion.     Cervical back: Normal range of motion and neck supple.  Skin:    General: Skin is warm and dry.     Capillary Refill: Capillary refill takes less than 2 seconds.     Findings: No rash.  Neurological:     Mental Status: The patient is awake and alert.      ED Results / Procedures / Treatments   Labs (all labs ordered are listed, but  only abnormal results are displayed) Labs Reviewed  WET PREP, GENITAL - Abnormal; Notable for the following components:      Result Value   Yeast Wet Prep HPF POC PRESENT (*)    Clue Cells Wet Prep HPF POC PRESENT (*)    All other components within normal limits  CHLAMYDIA/NGC RT PCR ([facility] ONLY)            HIV ANTIBODY (ROUTINE TESTING W REFLEX)  RPR     EKG     RADIOLOGY     PROCEDURES:  Critical Care performed:   Procedures   MEDICATIONS ORDERED IN ED: Medications  cefTRIAXone (ROCEPHIN) injection 500 mg (500 mg Intramuscular Given 03/22/22 0908)  lidocaine (PF) (XYLOCAINE) 1 % injection (1 mg Intramuscular Given 03/22/22 0908)  metroNIDAZOLE (FLAGYL) tablet 2,000 mg (2,000 mg Oral Given 03/22/22 0942)     IMPRESSION / MDM / ASSESSMENT AND PLAN / ED COURSE  I reviewed the triage vital signs and the nursing notes.   Differential diagnosis includes, but is not limited to, trichomonas, other sexually transmitted disease, less likely PID or TOA.  Patient is awake and alert, hemodynamically stable and afebrile.  She is hypertensive on arrival, though no headache, visual changes, chest pain, shortness of breath.  She  reports that she is always hypertensive when she comes to the emergency department.  No abdominal pain or pelvic pain, no dyspareunia.  I do not suspect PID or TOA.  She opted to self swab, declines pelvic exam.  Understands that I am may potentially miss things without doing a pelvic exam and she understands and agrees.  She was treated empirically for gonorrhea, chlamydia, and Flagyl.  She was instructed to take the doxycycline which was sent to her pharmacy.  She also would like to be tested for HIV and syphilis, though agrees to wait until these results return before empiric treatment.  She was empirically treated for trichomonas and gonorrhea/chlamydia. Patient was advised that there are many other STDs that patient could have, that we do not test for in the emergency department. Patient was advised to follow up with a primary care doctor to have the full panel of testing performed. Patient was advised to not have sexual intercourse until fully and properly tested and treated. Patient was advised that his partner also needs to be tested and treated. Patient understands and agrees with plan.   Patient's presentation is most consistent with acute complicated illness / injury requiring diagnostic workup.     FINAL CLINICAL IMPRESSION(S) / ED DIAGNOSES   Final diagnoses:  STD exposure     Rx / DC Orders   ED Discharge Orders          Ordered    doxycycline (ADOXA) 100 MG tablet  2 times daily        03/22/22 0935             Note:  This document was prepared using Dragon voice recognition software and may include unintentional dictation errors.   Keturah Shavers 03/22/22 1342    Loleta Rose, MD 03/22/22 [phone]

## 2022-03-22 NOTE — ED Triage Notes (Addendum)
Pt presents to ED with c/o of having been exposed to potential STD, pt concerned for trichomoniasis. Pt states HX of hypertension but "hasn't take meds in a while". Pt states I am also really mad I'm here right now for this"    Pt does state she needs to call and get put back on them (BP meds).

## 2022-03-22 NOTE — Discharge Instructions (Signed)
You were treated empirically for trich and gonorrhea in the ED. Take the doxycyline for the next week.  Your results are pending.  You can find them on MyChart.  Please do not have sexual intercourse until fully and properly tested and treated. Your partner also needs to be tested and treated.

## 2022-03-23 LAB — RPR: RPR Ser Ql: NONREACTIVE

## 2022-07-20 ENCOUNTER — Telehealth: Payer: Self-pay

## 2022-07-20 NOTE — Telephone Encounter (Signed)
Mychart msg sent

## 2022-10-08 ENCOUNTER — Emergency Department
Admission: EM | Admit: 2022-10-08 | Discharge: 2022-10-08 | Disposition: A | Discharge status: Home / Self Care | Payer: Medicaid Other | Attending: Emergency Medicine | Admitting: Emergency Medicine

## 2022-10-08 DIAGNOSIS — Y92410 Unspecified street and highway as the place of occurrence of the external cause: Secondary | ICD-10-CM | POA: Insufficient documentation

## 2022-10-08 DIAGNOSIS — D649 Anemia, unspecified: Secondary | ICD-10-CM | POA: Diagnosis not present

## 2022-10-08 DIAGNOSIS — S3992XA Unspecified injury of lower back, initial encounter: Secondary | ICD-10-CM | POA: Diagnosis present

## 2022-10-08 DIAGNOSIS — I10 Essential (primary) hypertension: Secondary | ICD-10-CM | POA: Insufficient documentation

## 2022-10-08 DIAGNOSIS — S39012A Strain of muscle, fascia and tendon of lower back, initial encounter: Secondary | ICD-10-CM | POA: Insufficient documentation

## 2022-10-08 LAB — BASIC METABOLIC PANEL
Anion gap: 8 (ref 5–15)
BUN: 17 mg/dL (ref 6–20)
CO2: 24 mmol/L (ref 22–32)
Calcium: 9.1 mg/dL (ref 8.9–10.3)
Chloride: 109 mmol/L (ref 98–111)
Creatinine, Ser: 0.7 mg/dL (ref 0.44–1.00)
GFR, Estimated: >60 mL/min (ref >60)
Glucose, Bld: 100 mg/dL — ABNORMAL HIGH (ref 70–99)
Potassium: 3.6 mmol/L (ref 3.5–5.1)
Sodium: 141 mmol/L (ref 135–145)

## 2022-10-08 LAB — CBC WITH DIFFERENTIAL/PLATELET
Abs Immature Granulocytes: 0.02 10*3/uL (ref 0.00–0.07)
Basophils Absolute: 0.1 10*3/uL (ref 0.0–0.1)
Basophils Relative: 1 %
Eosinophils Absolute: 0.3 10*3/uL (ref 0.0–0.5)
Eosinophils Relative: 4 %
HCT: 36 % (ref 36.0–46.0)
Hemoglobin: 10 g/dL — ABNORMAL LOW (ref 12.0–15.0)
Immature Granulocytes: 0 %
Lymphocytes Relative: 20 %
Lymphs Abs: 1.8 10*3/uL (ref 0.7–4.0)
MCH: 20.5 pg — ABNORMAL LOW (ref 26.0–34.0)
MCHC: 27.8 g/dL — ABNORMAL LOW (ref 30.0–36.0)
MCV: 73.8 fL — ABNORMAL LOW (ref 80.0–100.0)
Monocytes Absolute: 0.5 10*3/uL (ref 0.1–1.0)
Monocytes Relative: 5 %
Neutro Abs: 6.1 10*3/uL (ref 1.7–7.7)
Neutrophils Relative %: 70 %
Platelets: 331 10*3/uL (ref 150–400)
RBC: 4.88 MIL/uL (ref 3.87–5.11)
RDW: 19.5 % — ABNORMAL HIGH (ref 11.5–15.5)
WBC: 8.8 10*3/uL (ref 4.0–10.5)
nRBC: 0 % (ref 0.0–0.2)

## 2022-10-08 LAB — POC URINE PREG, ED: Preg Test, Ur: NEGATIVE

## 2022-10-08 MED ORDER — CYCLOBENZAPRINE HCL 5 MG PO TABS: 5.0000 mg | ORAL_TABLET | Freq: Three times a day (TID) | ORAL | 0 refills | Status: AC | PRN

## 2022-10-08 MED ORDER — ACETAMINOPHEN 500 MG PO TABS
1000.0000 mg | ORAL_TABLET | Freq: Once | ORAL | Status: AC
  Administered 2022-10-08: 1000 mg via ORAL
  Filled 2022-10-08: qty 2

## 2022-10-08 MED ORDER — AMLODIPINE BESYLATE 5 MG PO TABS: 5.0000 mg | ORAL_TABLET | Freq: Every day | ORAL | 2 refills | Status: DC

## 2022-10-08 MED ORDER — LIDOCAINE 5 % EX PTCH
1.0000 | MEDICATED_PATCH | Freq: Once | CUTANEOUS | Status: DC
Start: 2022-10-08 — End: 2022-10-08
  Administered 2022-10-08: 1 via TRANSDERMAL
  Filled 2022-10-08: qty 1

## 2022-10-08 MED ORDER — LIDOCAINE 5 % EX PTCH: 1.0000 | MEDICATED_PATCH | Freq: Two times a day (BID) | CUTANEOUS | 0 refills | Status: AC
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
## 2022-10-08 NOTE — Discharge Instructions (Addendum)
Do not drive or work with the Flexeril as this can make you sleepy.  You can take Tylenol 1 g every 8 hours and you can take ibuprofen 600 every 6-8 hours with food with the lidocaine patches.  Return to the ER if you develop worsening symptoms or any other concerns.  Please schedule a follow-up appointment for your elevated blood pressure.  We have started you on a low-dose amlodipine to try to help.  Return to the ER if you develop worsening symptoms or any other concerns  Follow up PCP for your anemia
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
## 2024-04-03 ENCOUNTER — Inpatient Hospital Stay (HOSPITAL_COMMUNITY)
Admission: AD | Admit: 2024-04-03 | Discharge: 2024-04-06 | DRG: 066 | Disposition: A | Attending: Neurology | Admitting: Neurology

## 2024-04-03 ENCOUNTER — Encounter (HOSPITAL_COMMUNITY): Payer: Self-pay

## 2024-04-03 ENCOUNTER — Emergency Department
Admission: EM | Admit: 2024-04-03 | Discharge: 2024-04-03 | Disposition: A | Discharge status: Other Acute Inpatient Hospital | Attending: Emergency Medicine | Admitting: Emergency Medicine

## 2024-04-03 ENCOUNTER — Emergency Department

## 2024-04-03 DIAGNOSIS — F1410 Cocaine abuse, uncomplicated: Secondary | ICD-10-CM | POA: Diagnosis not present

## 2024-04-03 DIAGNOSIS — R471 Dysarthria and anarthria: Secondary | ICD-10-CM | POA: Diagnosis not present

## 2024-04-03 DIAGNOSIS — E785 Hyperlipidemia, unspecified: Secondary | ICD-10-CM | POA: Diagnosis present

## 2024-04-03 DIAGNOSIS — F1910 Other psychoactive substance abuse, uncomplicated: Secondary | ICD-10-CM | POA: Diagnosis not present

## 2024-04-03 DIAGNOSIS — Z7401 Bed confinement status: Secondary | ICD-10-CM | POA: Diagnosis not present

## 2024-04-03 DIAGNOSIS — R29705 NIHSS score 5: Secondary | ICD-10-CM | POA: Diagnosis not present

## 2024-04-03 DIAGNOSIS — I69391 Dysphagia following cerebral infarction: Secondary | ICD-10-CM | POA: Diagnosis not present

## 2024-04-03 DIAGNOSIS — F1090 Alcohol use, unspecified, uncomplicated: Secondary | ICD-10-CM | POA: Diagnosis not present

## 2024-04-03 DIAGNOSIS — D509 Iron deficiency anemia, unspecified: Secondary | ICD-10-CM | POA: Insufficient documentation

## 2024-04-03 DIAGNOSIS — R29706 NIHSS score 6: Secondary | ICD-10-CM | POA: Diagnosis not present

## 2024-04-03 DIAGNOSIS — R410 Disorientation, unspecified: Secondary | ICD-10-CM | POA: Diagnosis not present

## 2024-04-03 DIAGNOSIS — I771 Stricture of artery: Secondary | ICD-10-CM | POA: Diagnosis not present

## 2024-04-03 DIAGNOSIS — I110 Hypertensive heart disease with heart failure: Secondary | ICD-10-CM | POA: Diagnosis not present

## 2024-04-03 DIAGNOSIS — F1210 Cannabis abuse, uncomplicated: Secondary | ICD-10-CM | POA: Diagnosis not present

## 2024-04-03 DIAGNOSIS — E876 Hypokalemia: Secondary | ICD-10-CM | POA: Diagnosis not present

## 2024-04-03 DIAGNOSIS — I10 Essential (primary) hypertension: Secondary | ICD-10-CM | POA: Diagnosis not present

## 2024-04-03 DIAGNOSIS — I509 Heart failure, unspecified: Secondary | ICD-10-CM | POA: Diagnosis not present

## 2024-04-03 DIAGNOSIS — Z7902 Long term (current) use of antithrombotics/antiplatelets: Secondary | ICD-10-CM | POA: Diagnosis not present

## 2024-04-03 DIAGNOSIS — I6602 Occlusion and stenosis of left middle cerebral artery: Secondary | ICD-10-CM | POA: Diagnosis not present

## 2024-04-03 DIAGNOSIS — I63512 Cerebral infarction due to unspecified occlusion or stenosis of left middle cerebral artery: Secondary | ICD-10-CM | POA: Insufficient documentation

## 2024-04-03 DIAGNOSIS — R1310 Dysphagia, unspecified: Secondary | ICD-10-CM | POA: Diagnosis not present

## 2024-04-03 DIAGNOSIS — R29810 Facial weakness: Secondary | ICD-10-CM | POA: Diagnosis not present

## 2024-04-03 DIAGNOSIS — I639 Cerebral infarction, unspecified: Secondary | ICD-10-CM | POA: Diagnosis not present

## 2024-04-03 DIAGNOSIS — I6389 Other cerebral infarction: Secondary | ICD-10-CM | POA: Diagnosis not present

## 2024-04-03 DIAGNOSIS — Y900 Blood alcohol level of less than 20 mg/100 ml: Secondary | ICD-10-CM | POA: Diagnosis present

## 2024-04-03 DIAGNOSIS — R29818 Other symptoms and signs involving the nervous system: Secondary | ICD-10-CM | POA: Diagnosis not present

## 2024-04-03 DIAGNOSIS — R4701 Aphasia: Secondary | ICD-10-CM | POA: Diagnosis not present

## 2024-04-03 DIAGNOSIS — Z8673 Personal history of transient ischemic attack (TIA), and cerebral infarction without residual deficits: Secondary | ICD-10-CM | POA: Diagnosis not present

## 2024-04-03 DIAGNOSIS — Z7982 Long term (current) use of aspirin: Secondary | ICD-10-CM | POA: Diagnosis not present

## 2024-04-03 DIAGNOSIS — F17210 Nicotine dependence, cigarettes, uncomplicated: Secondary | ICD-10-CM | POA: Diagnosis not present

## 2024-04-03 DIAGNOSIS — R29701 NIHSS score 1: Secondary | ICD-10-CM | POA: Diagnosis not present

## 2024-04-03 DIAGNOSIS — I7090 Unspecified atherosclerosis: Secondary | ICD-10-CM | POA: Diagnosis not present

## 2024-04-03 DIAGNOSIS — I6501 Occlusion and stenosis of right vertebral artery: Secondary | ICD-10-CM | POA: Diagnosis not present

## 2024-04-03 DIAGNOSIS — F17200 Nicotine dependence, unspecified, uncomplicated: Secondary | ICD-10-CM | POA: Diagnosis present

## 2024-04-03 DIAGNOSIS — I6521 Occlusion and stenosis of right carotid artery: Secondary | ICD-10-CM | POA: Diagnosis not present

## 2024-04-03 LAB — ETHANOL: Alcohol, Ethyl (B): <15 mg/dL (ref <15)

## 2024-04-03 LAB — COMPREHENSIVE METABOLIC PANEL WITH GFR
ALT: 9 U/L (ref 0–44)
AST: 16 U/L (ref 15–41)
Albumin: 4.5 g/dL (ref 3.5–5.0)
Alkaline Phosphatase: 77 U/L (ref 38–126)
Anion gap: 12 (ref 5–15)
BUN: 9 mg/dL (ref 6–20)
CO2: 23 mmol/L (ref 22–32)
Calcium: 9.3 mg/dL (ref 8.9–10.3)
Chloride: 103 mmol/L (ref 98–111)
Creatinine, Ser: 0.61 mg/dL (ref 0.44–1.00)
GFR, Estimated: >60 mL/min (ref >60)
Glucose, Bld: 107 mg/dL — ABNORMAL HIGH (ref 70–99)
Potassium: 3.2 mmol/L — ABNORMAL LOW (ref 3.5–5.1)
Sodium: 137 mmol/L (ref 135–145)
Total Bilirubin: 0.8 mg/dL (ref 0.0–1.2)
Total Protein: 7.6 g/dL (ref 6.5–8.1)

## 2024-04-03 LAB — DIFFERENTIAL
Abs Immature Granulocytes: 0.04 K/uL (ref 0.00–0.07)
Basophils Absolute: 0.1 K/uL (ref 0.0–0.1)
Basophils Relative: 1 %
Eosinophils Absolute: 0.5 K/uL (ref 0.0–0.5)
Eosinophils Relative: 5 %
Immature Granulocytes: 0 %
Lymphocytes Relative: 24 %
Lymphs Abs: 2.4 K/uL (ref 0.7–4.0)
Monocytes Absolute: 0.6 K/uL (ref 0.1–1.0)
Monocytes Relative: 6 %
Neutro Abs: 6.4 K/uL (ref 1.7–7.7)
Neutrophils Relative %: 64 %

## 2024-04-03 LAB — RAPID URINE DRUG SCREEN, HOSP PERFORMED
Amphetamines: NOT DETECTED
Barbiturates: NOT DETECTED
Benzodiazepines: NOT DETECTED
Cocaine: POSITIVE — AB
Opiates: NOT DETECTED
Tetrahydrocannabinol: POSITIVE — AB

## 2024-04-03 LAB — CBC
HCT: 35.9 % — ABNORMAL LOW (ref 36.0–46.0)
HCT: 31.3 % — ABNORMAL LOW (ref 36.0–46.0)
Hemoglobin: 10.2 g/dL — ABNORMAL LOW (ref 12.0–15.0)
Hemoglobin: 9.1 g/dL — ABNORMAL LOW (ref 12.0–15.0)
MCH: 19.8 pg — ABNORMAL LOW (ref 26.0–34.0)
MCH: 20 pg — ABNORMAL LOW (ref 26.0–34.0)
MCHC: 28.4 g/dL — ABNORMAL LOW (ref 30.0–36.0)
MCHC: 29.1 g/dL — ABNORMAL LOW (ref 30.0–36.0)
MCV: 69.6 fL — ABNORMAL LOW (ref 80.0–100.0)
MCV: 68.6 fL — ABNORMAL LOW (ref 80.0–100.0)
Platelets: 332 K/uL (ref 150–400)
Platelets: 278 K/uL (ref 150–400)
RBC: 5.16 MIL/uL — ABNORMAL HIGH (ref 3.87–5.11)
RBC: 4.56 MIL/uL (ref 3.87–5.11)
RDW: 19.1 % — ABNORMAL HIGH (ref 11.5–15.5)
RDW: 19.1 % — ABNORMAL HIGH (ref 11.5–15.5)
WBC: 10.1 K/uL (ref 4.0–10.5)
WBC: 8.9 K/uL (ref 4.0–10.5)
nRBC: 0 % (ref 0.0–0.2)
nRBC: 0 % (ref 0.0–0.2)

## 2024-04-03 LAB — APTT: aPTT: 28 s (ref 24–36)

## 2024-04-03 LAB — PROTIME-INR
INR: 0.9 (ref 0.8–1.2)
Prothrombin Time: 13.2 s (ref 11.4–15.2)

## 2024-04-03 LAB — MRSA NEXT GEN BY PCR, NASAL: MRSA by PCR Next Gen: NOT DETECTED

## 2024-04-03 LAB — CBG MONITORING, ED: Glucose-Capillary: 108 mg/dL — ABNORMAL HIGH (ref 70–99)

## 2024-04-03 LAB — POC URINE PREG, ED: Preg Test, Ur: NEGATIVE

## 2024-04-03 MED ORDER — CHLORHEXIDINE GLUCONATE CLOTH 2 % EX PADS
6.0000 | MEDICATED_PAD | Freq: Every day | CUTANEOUS | Status: DC
  Administered 2024-04-03 – 2024-04-06 (×3): 6 via TOPICAL

## 2024-04-03 MED ORDER — CLOPIDOGREL BISULFATE 75 MG PO TABS: 75.0000 mg | ORAL_TABLET | Freq: Every day | ORAL | Status: DC

## 2024-04-03 MED ORDER — ORAL CARE MOUTH RINSE: 15.0000 mL | OROMUCOSAL | Status: DC | PRN

## 2024-04-03 MED ORDER — ACETAMINOPHEN 325 MG PO TABS
650.0000 mg | ORAL_TABLET | ORAL | Status: DC | PRN
  Filled 2024-04-03: qty 2

## 2024-04-03 MED ORDER — SODIUM CHLORIDE 0.9 % IV SOLN: INTRAVENOUS | Status: DC

## 2024-04-03 MED ORDER — IOHEXOL 350 MG/ML SOLN
100.0000 mL | Freq: Once | INTRAVENOUS | Status: AC | PRN
  Administered 2024-04-03: 100 mL via INTRAVENOUS

## 2024-04-03 MED ORDER — CLOPIDOGREL BISULFATE 75 MG PO TABS
75.0000 mg | ORAL_TABLET | Freq: Every day | ORAL | Status: DC
  Administered 2024-04-04 – 2024-04-06 (×3): 75 mg via ORAL
  Filled 2024-04-03 (×3): qty 1

## 2024-04-03 MED ORDER — ASPIRIN 325 MG PO TABS
325.0000 mg | ORAL_TABLET | Freq: Once | ORAL | Status: AC
  Administered 2024-04-03: 325 mg via ORAL
  Filled 2024-04-03: qty 1

## 2024-04-03 MED ORDER — SENNOSIDES-DOCUSATE SODIUM 8.6-50 MG PO TABS: 1.0000 | ORAL_TABLET | Freq: Every evening | ORAL | Status: DC | PRN

## 2024-04-03 MED ORDER — ACETAMINOPHEN 160 MG/5ML PO SOLN: 650.0000 mg | ORAL | Status: DC | PRN

## 2024-04-03 MED ORDER — ENOXAPARIN SODIUM 40 MG/0.4ML IJ SOSY
40.0000 mg | PREFILLED_SYRINGE | INTRAMUSCULAR | Status: DC
  Administered 2024-04-03 – 2024-04-05 (×3): 40 mg via SUBCUTANEOUS
  Filled 2024-04-03 (×3): qty 0.4

## 2024-04-03 MED ORDER — ASPIRIN 81 MG PO TBEC: 81.0000 mg | DELAYED_RELEASE_TABLET | Freq: Every day | ORAL | Status: DC

## 2024-04-03 MED ORDER — LABETALOL HCL 5 MG/ML IV SOLN: 5.0000 mg | INTRAVENOUS | Status: DC | PRN

## 2024-04-03 MED ORDER — ACETAMINOPHEN 650 MG RE SUPP: 650.0000 mg | RECTAL | Status: DC | PRN

## 2024-04-03 MED ORDER — SODIUM CHLORIDE 0.9 % IV SOLN: INTRAVENOUS | Status: AC

## 2024-04-03 MED ORDER — STROKE: EARLY STAGES OF RECOVERY BOOK
Freq: Once | Status: AC
  Filled 2024-04-03: qty 1

## 2024-04-03 MED ORDER — ASPIRIN 81 MG PO CHEW
81.0000 mg | CHEWABLE_TABLET | Freq: Every day | ORAL | Status: DC
  Administered 2024-04-04 – 2024-04-06 (×3): 81 mg via ORAL
  Filled 2024-04-03 (×3): qty 1

## 2024-04-03 MED ORDER — SODIUM CHLORIDE 0.9% FLUSH
3.0000 mL | Freq: Once | INTRAVENOUS | Status: AC
  Administered 2024-04-03: 3 mL via INTRAVENOUS

## 2024-04-03 MED ORDER — SODIUM CHLORIDE 0.9 % IV BOLUS
1000.0000 mL | Freq: Once | INTRAVENOUS | Status: AC
  Administered 2024-04-03: 1000 mL via INTRAVENOUS

## 2024-04-03 MED ORDER — CLOPIDOGREL BISULFATE 75 MG PO TABS
300.0000 mg | ORAL_TABLET | Freq: Once | ORAL | Status: AC
  Administered 2024-04-03: 300 mg via ORAL
  Filled 2024-04-03: qty 4

## 2024-04-03 NOTE — Progress Notes (Signed)
 CODE STROKE- PHARMACY COMMUNICATION   Time CODE STROKE called/page received: 11:51  Time response to CODE STROKE was made (in person or via phone): 11:55  Time Stroke Kit retrieved from [employer] (only if needed): Not needed. Last well known 1930 yesterday  Name of Provider/Nurse contacted: Dr. Matthews   Past Medical History:  Diagnosis Date   Hypertension    Prior to Admission medications   Medication Sig Start Date End Date Taking? Authorizing Provider  amLODipine  (NORVASC ) 5 MG tablet Take 1 tablet (5 mg total) by mouth daily. 10/08/22 01/06/23  Ernest Ronal BRAVO, MD    Ransom Blanch PGY-1 Pharmacy Resident  [facility]  04/03/2024 11:56 AM

## 2024-04-03 NOTE — ED Triage Notes (Signed)
 First nurse note: pt to ED for aphasia started at 2100 last night, denies other sx.

## 2024-04-03 NOTE — Consult Note (Addendum)
 NEUROLOGY CONSULT NOTE   Date of service: April 03, 2024 Patient Name: Sheri Mendoza MRN:  [id number] DOB:  Nov 15, 1980 Chief Complaint: stroke code Requesting Provider: Claudene Rover, MD  History of Present Illness   Code stroke paged out 1144 Neurology arrival 1150  Next of kin are 2 adult sons: Ole Stalling [phone] Diann Stalling [phone]  Sheri Mendoza is a 43 y.o. female with hx of HTN who presents with severe expressive aphasia since yesterday evening. Per sons at bedside LKW 1930 yesterday with sx onset at 2030 yesterday. She did not improve so they brought her to ED today. Her comprehension is intact. Exam notable for severe expressive aphasia, R NLF flattening, RUE and RLE drift with bobbing movements. NIHSS = 8. CT head showed small acute or subacute cortical infarct in the left anterior operculum and more subtle age-indeterminate hypodensity near the genu of the left internal capsule. ASPECTS 8. TNK was not considered 2/2 presentation outside the window. CTA/CTP showed left MCA distal M1 near occlusion versus occlusion with the only abnormality detected on CT perfusion (37 mL) was with criteria of Tmax greater than 4 seconds.  I discussed this imaging at length with Dr. Shona of neuroradiology as well as Dr. Ray of neuro intervention.  Dr. Ray and I felt that this extremely short segment abnormality in the left MCA was likely severe stenosis rather than thromboembolus and agreed that the risks of emergent intervention would likely outweigh the benefit.  Of note sons at bedside report that patient uses both crack and meth and that they did not bring her to the hospital yesterday because they thought her abnormal speech was 2/2 drug intoxication.  LKW: 2030 yesterday Modified rankin score: 0-Completely asymptomatic and back to baseline post- stroke IV Thrombolysis: no, outside window EVT: no see above  NIHSS components Score: Comment  1a Level of Conscious 0[x]  1[]  2[]  3[]       1b LOC Questions 0[]  1[]  2[x]       1c LOC Commands 0[x]  1[]  2[]       2 Best Gaze 0[x]  1[]  2[]       3 Visual 0[x]  1[]  2[]  3[]      4 Facial Palsy 0[]  1[x]  2[]  3[]      5a Motor Arm - left 0[x]  1[]  2[]  3[]  4[]  UN[]    5b Motor Arm - Right 0[]  1[x]  2[]  3[]  4[]  UN[]    6a Motor Leg - Left 0[x]  1[]  2[]  3[]  4[]  UN[]    6b Motor Leg - Right 0[]  1[x]  2[]  3[]  4[]  UN[]    7 Limb Ataxia 0[x]  1[]  2[]  UN[]      8 Sensory 0[]  1[x]  2[]  UN[]      9 Best Language 0[]  1[]  2[x]  3[]      10 Dysarthria 0[x]  1[]  2[]  UN[]      11 Extinct. and Inattention 0[x]  1[]  2[]       TOTAL:  8     ROS   Unable to ascertain due to aphasia  Past History   Past Medical History:  Diagnosis Date   Hypertension     Past Surgical History:  Procedure Laterality Date   TONSILLECTOMY      Family History: No family history on file.  Social History  reports that she has been smoking cigarettes. She has never used smokeless tobacco. She reports that she does not currently use alcohol. She reports that she does not use drugs.  No Known Allergies  Medications   Current Facility-Administered Medications:    sodium chloride flush (NS) 0.9 %  injection 3 mL, 3 mL, Intravenous, Once, Claudene Rover, MD  Current Outpatient Medications:    amLODipine  (NORVASC ) 5 MG tablet, Take 1 tablet (5 mg total) by mouth daily., Disp: 30 tablet, Rfl: 2  Vitals   Vitals:   05-01-24 1124 05-01-2024 1127  BP:  (!) 239/152  Pulse: 87 90  Resp: 17 16  Temp: 97.9 F (36.6 C) 97.9 F (36.6 C)  TempSrc: Oral Oral  SpO2: 100% 100%    There is no height or weight on file to calculate BMI.   Physical Exam   Gen: patient lying in bed, NAD CV: extremities appear well-perfused Resp: normal WOB  Neurologic exam MS: alert, unable to answer orientation questions 2/2 aphasia but is able to nod yes/no appropriately, follows commands Speech: no dysarthria, severe expressive aphasia CN: PERRL, blinks to threat bilat, EOMI, sensation intact,  R NLF flattening, hearing intact to voice Motor: drift with bobbing movements RUE and RLE but does not hit bed, LUE and LLE full strength Sensory: Impaired to LT RUE Reflexes: 2+ symm with toes down bilat Coordination: FNF intact bilat Gait: deferred    Labs/Imaging/Neurodiagnostic studies   CBC:  Recent Labs  Lab 05/01/2024 1136  WBC 10.1  HGB 10.2*  HCT 35.9*  MCV 69.6*  PLT 332   Basic Metabolic Panel:  Lab Results  Component Value Date   NA 141 10/08/2022   K 3.6 10/08/2022   CO2 24 10/08/2022   GLUCOSE 100 (H) 10/08/2022   BUN 17 10/08/2022   CREATININE 0.70 10/08/2022   CALCIUM 9.1 10/08/2022   GFRNONAA >60 10/08/2022   Lipid Panel: No results found for: LDLCALC HgbA1c: No results found for: HGBA1C Urine Drug Screen: No results found for: LABOPIA, COCAINSCRNUR, LABBENZ, AMPHETMU, THCU, LABBARB  Alcohol Level No results found for: [facility] INR  Lab Results  Component Value Date   INR 0.9 05-01-2024   APTT  Lab Results  Component Value Date   APTT 28 May 01, 2024   AED levels: No results found for: PHENYTOIN, ZONISAMIDE, LAMOTRIGINE, LEVETIRACETA  CT Head without contrast(Personally reviewed): 1. Small acute or subacute cortical infarct in the left anterior operculum (2 cm), and more subtle age-indeterminate hypodensity near the genu of the left internal capsule. ASPECTS 8. 2. No acute intracranial hemorrhage or mass effect.  CT angio Head and Neck with contrast(Personally reviewed): 1. CTA with Left MCA distal M1 occlusion/near occlusion, strongly suggesting ELVO. But with only Tmax >4 seconds parameter abnormality detected on CT Perfusion (37 mL). Plain Head CT images are still pending at this time. 2. Study discussed by telephone with Dr Matthews at 1220 hours on May 01, 2024. 3. Mild to moderate upstream Right carotid (primarily RICA siphon) atherosclerosis. And also calcified plaque in the Right vertebral V3 segment. But no other  hemodynamically significant stenosis.  ASSESSMENT   Sheri Mendoza is a 43 y.o. female with hx HTN, multisubstance abuse (crack, meth per family) who presents with expressive aphasia and mild R sided weakness in the setting of acute vs subacute infarct on head CT and high-grade stenosis of L M1. TNK was not considered 2/2 presentation outside the window. CTA/CTP showed left MCA distal M1 near occlusion versus occlusion with the only abnormality detected on CT perfusion (37 mL) was with criteria of Tmax greater than 4 seconds.  I discussed this imaging at length with Dr. Shona of neuroradiology as well as Dr. Ray of neuro intervention.  Dr. Ray and I felt that this extremely short segment abnormality in the left  MCA was likely severe stenosis rather than thromboembolus and agreed that the risks of emergent intervention at this time would likely outweigh the benefit (nothing amenable to thrombectomy, poor candidate for intracranial stenting). I am going to transfer her to 4N neuro ICU for close monitoring bc if her exam were to deteriorate at that point intervention with intracranial stenting may be considered. D/w Drs. Ray and [place] who are in agreement.  Family reports patient's mother had 5-6 strokes beginning in her 22s so will also add hypercoag panel.  RECOMMENDATIONS   - ASAP transfer to [facility] neuro ICU 4N for close neurologic monitoring in the setting of severe stenosis of L M1. At this time lesion not felt amenable to thrombectomy; IR may consider intervention with intracranial stenting but only if her exam deteriorates - Permissive HTN x48 hrs goal BP <220/110. PRN labetalol or hydralazine if BP above these parameters. Avoid oral antihypertensives. - MRI brain wo contrast - TTE w/ bubble - Check A1c and LDL + add statin per guidelines - ASA 325mg  now f/b 81mkg daily - Plavix 300mg  load f/b 75mg  daily - q 1 hr neurochecks, NIHSS - q 30 min VS while awaiting transfer - Please order  hypercoag panel after arrival to [facility] - Tele - PT/OT/SLP - Stroke education - SW consult for substance abuse counseling when able to participate - Amb referral to neurology upon discharge   For any questions about patient prior to transfer to [facility] and before 8pm, please page me at [phone]. For any questions after her arrival to [facility] or after 8pm please page the [facility] neurohospitalist listed in amion. ______________________________________________________________________    Bonney Elida CHRISTELLA Matthews, MD [facility]  This patient is critically ill and at significant risk of neurological worsening, death and care requires constant monitoring of vital signs, hemodynamics,respiratory and cardiac monitoring, neurological assessment, discussion with family, other specialists and medical decision making of high complexity. I spent 70 minutes of neurocritical care time  in the care of  this patient. This was time spent independent of any time provided by nurse practitioner or PA.  Elida Matthews, MD [facility] [phone]  If 7pm- 7am, please page neurology on call as listed in AMION.

## 2024-04-03 NOTE — ED Notes (Signed)
 Called [facility] @ 11:41 and spoke with Debby to activate a Code Stroke with the following symptoms. Pt with expressive aphasia, R facial droop, and sensation deficits. LKW 1930 yesterday.

## 2024-04-03 NOTE — ED Notes (Signed)
 Patient transported to CT

## 2024-04-03 NOTE — ED Provider Notes (Signed)
 [facility] Provider Note    Event Date/Time   First MD Initiated Contact with Patient 04/03/24 1149     (approximate)   History   Aphasia and Facial Droop   HPI  Sheri Mendoza is a 43 y.o. female who presents to the ED for evaluation of Aphasia and Facial Droop   Patient presents for evaluation of expressive aphasia since about 7:30 PM, right sided weakness and sensation changes.  Regular crack cocaine abuse.  Patient's children found her yesterday evening with difficulty speaking but thought this was just related to her known drug abuse but due to persistent symptoms today they present to the ED.   Physical Exam   Triage Vital Signs: ED Triage Vitals  Encounter Vitals Group     BP 04/03/24 1127 (!) 239/152     Girls Systolic BP Percentile --      Girls Diastolic BP Percentile --      Boys Systolic BP Percentile --      Boys Diastolic BP Percentile --      Pulse Rate 04/03/24 1124 87     Resp 04/03/24 1124 17     Temp 04/03/24 1124 97.9 F (36.6 C)     Temp Source 04/03/24 1124 Oral     SpO2 04/03/24 1124 100 %     Weight --      Height --      Head Circumference --      Peak Flow --      Pain Score --      Pain Loc --      Pain Education --      Exclude from Growth Chart --     Most recent vital signs: Vitals:   04/03/24 1416 04/03/24 1416  BP: (!) 212/106   Pulse: 93   Resp: 19   Temp:  98.1 F (36.7 C)  SpO2: 100%     General: Awake, no distress.  CV:  Good peripheral perfusion.  Resp:  Normal effort.  Abd:  No distention.  MSK:  No deformity noted.  Neuro:  NIH of about 8 with expressive aphasia, facial asymmetry, right sided drift and decreased sensation Other:     ED Results / Procedures / Treatments   Labs (all labs ordered are listed, but only abnormal results are displayed) Labs Reviewed  CBC - Abnormal; Notable for the following components:      Result Value   RBC 5.16 (*)    Hemoglobin 10.2 (*)    HCT 35.9  (*)    MCV 69.6 (*)    MCH 19.8 (*)    MCHC 28.4 (*)    RDW 19.1 (*)    All other components within normal limits  COMPREHENSIVE METABOLIC PANEL WITH GFR - Abnormal; Notable for the following components:   Potassium 3.2 (*)    Glucose, Bld 107 (*)    All other components within normal limits  CBG MONITORING, ED - Abnormal; Notable for the following components:   Glucose-Capillary 108 (*)    All other components within normal limits  PROTIME-INR  APTT  DIFFERENTIAL  ETHANOL  POC URINE PREG, ED  I-STAT CREATININE, ED    EKG Sinus rhythm with a rate of 94 bpm.  Normal axis and intervals.  Signs of LVH.  No clear signs of acute ischemia  RADIOLOGY CT head interpreted by me without ICH, signs of small ischemic stroke are present CTA as below  Official radiology report(s): CT HEAD CODE  STROKE WO CONTRAST Result Date: 04/03/2024 EXAM: CT HEAD WITHOUT CONTRAST 04/03/2024 11:42:28 AM TECHNIQUE: CT of the head was performed without the administration of intravenous contrast. Automated exposure control, iterative reconstruction, and/or weight based adjustment of the mA/kV was utilized to reduce the radiation dose to as low as reasonably achievable. COMPARISON: Head CT 11/29/2006. CLINICAL HISTORY: 43 year old female with acute neuro deficit, stroke suspected. FINDINGS: BRAIN AND VENTRICLES: Brain volume remains normal for age. No acute hemorrhage. No hydrocephalus. No extra-axial collection. No mass effect or midline shift. Abnormal cortical hypodensity left anterior operculum on series 2 image 16. This appears to be a fairly small area of roughly 2 cm involvement. This has an acute or subacute appearance. Asymmetric hypodensity near the genu of the left internal capsule on series 2 image 14 is also new but age indeterminate. Elsewhere in the left MCA territory gray white differentiation appears maintained. Outside of the left MCA territory normal gray white differentiation. No suspicious  intracranial vascular hyperdensity. ORBITS: No acute abnormality. SINUSES: Paranasal sinuses, middle ears and mastoids remain well aerated. SOFT TISSUES AND SKULL: No acute soft tissue abnormality. No skull fracture. There is slight rightward gaze. alberta stroke program early CT score (aspects) ----- Ganglionic (caudate, ic, lentiform nucleus, insula, M1-m3): 6 Supraganglionic (m4-m6): 2 Total: 8 IMPRESSION: 1. Small acute or subacute cortical infarct in the left anterior operculum (2 cm), and more subtle age-indeterminate hypodensity near the genu of the left internal capsule. ASPECTS 8. 2. No acute intracranial hemorrhage or mass effect. 3. This was discussed by telephone with Dr Matthews at 1233 hours on 04/03/2024. See also CTA and CTP reported prior to this exam. Electronically signed by: Helayne Hurst MD 04/03/2024 12:36 PM EST RP Workstation: [id number]   CT ANGIO HEAD NECK W WO CM W PERF (CODE STROKE) Result Date: 04/03/2024 EXAM: CT ANGIOGRAPHY OF THE HEAD AND NECK CT PERFUSION BRAIN 09/15/2022 TECHNIQUE: Contiguous axial images were obtained from the base of the skull through the vertex without intravenous contrast. Multidetector CT imaging of the head and neck was performed using the standard protocol during bolus administration of intravenous contrast. 3D postprocessing with multiplanar reconstructions and MIPs was performed to evaluate the vascular anatomy. Carotid stenosis measurements (when applicable) are obtained utilizing NASCET criteria, using the distal internal carotid diameter as the denominator. Cerebral perfusion analysis using computed tomography with contrast administration, including post-processing of parametric maps with determination of cerebral blood flow, cerebral blood volume, mean transit time and time-to-maximum. RADIATION DOSE REDUCTION: This exam was performed according to the departmental dose-optimization program which includes automated exposure control, adjustment of the mA  and/or kV according to patient size and/or use of iterative reconstruction technique. CONTRAST: Intravenous contrast was administered. COMPARISON: None Available. CLINICAL HISTORY: 43 year old female, code stroke presentation. FINDINGS: CTA NECK: AORTIC ARCH AND ARCH VESSELS: Normal 3 vessel aortic arch. No dissection or arterial injury. No significant stenosis of the brachiocephalic or subclavian arteries. Proximal right subclavian artery and right vertebral artery V1 and V2 segments appear patent and normal. The right vertebral artery is somewhat dominant. Proximal left subclavian artery soft and calcified plaque without stenosis. Normal left vertebral artery origin. Nondominant left vertebral artery is diminutive but patent and within limits to the vertebrobasilar junction. Normal left PICA origin. CERVICAL CAROTID ARTERIES: Patent right carotid artery through the right ICA siphon with no significant atherosclerosis or stenosis. Mild tortuosity. Normal right posterior communicating artery origin. Left carotid bifurcation soft and calcified atherosclerotic plaque with no associated stenosis. Patent left ICA with mild  tortuosity below the skull base. Left ICA siphon is patent with mild to moderate calcified plaque in the cavernous segment, but no convincing left ICA stenosis. Left posterior communicating artery origin appears normal. No dissection or arterial injury. CERVICAL VERTEBRAL ARTERIES: Right vertebral artery V1 and V2 segments appear patent and normal. The right vertebral artery is somewhat dominant. There is mild to moderate calcified plaque in the right vertebral artery V3 segment with only mild associated stenosis. Right vertebral artery V4 segment and vertebral basilar junction are normal. Normal left vertebral artery origin. Nondominant left vertebral artery is diminutive but patent and within limits to the vertebrobasilar junction. Normal left PICA origin. No dissection or arterial injury. LUNGS AND  MEDIASTINUM: Negative visible upper chest. SOFT TISSUES: Nonvascular neck soft tissue spaces are within normal limits. BONES: No acute abnormality. CTA HEAD: ANTERIOR CIRCULATION: No significant stenosis of the internal carotid arteries. Patent MCA and ACA origins. The right ACA A1 segment is dominant, the left is diminutive. The anterior communicating artery is ectatic, without discrete aneurysm. Bilateral ACA branches are within normal limits. Left MCA M1 segment is occluded. No aneurysm. POSTERIOR CIRCULATION: Patent basilar artery without stenosis. SCA origins appear patent. There are fetal type bilateral PCA origins. Bilateral PCA branches are symmetric and within normal limits. Right vertebral artery V4 segment and vertebral basilar junction are normal. Nondominant left vertebral artery is diminutive but patent and within limits to the vertebrobasilar junction. No aneurysm. OTHER: No dural venous sinus thrombosis on this non-dedicated study. CT PERFUSION: EXAM QUALITY: Exam quality is adequate with diagnostic perfusion maps. No significant motion artifact. Appropriate arterial inflow and venous outflow curves. CORE INFARCT (CBF<30% volume): 0 mL TOTAL HYPOPERFUSION (Tmax>6s volume): 0 mL PENUMBRA: not applicable Location: not applicable ADDITIONAL PERFUSION FINDINGS: No cerebral blood flow or cerebral blood volume perfusion parameter changes. However, 37 mL of Tmax greater than 4 s volume is detected in the left MCA territory. IMPRESSION: 1. CTA with Left MCA distal M1 occlusion/near occlusion, strongly suggesting ELVO. But with only Tmax >4 seconds parameter abnormality detected on CT Perfusion (37 mL). Plain Head CT images are still pending at this time. 2. Study discussed by telephone with Dr Matthews at 1220 hours on 04/03/2024. 3. Mild to moderate upstream Right carotid (primarily RICA siphon) atherosclerosis. And also calcified plaque in the Right vertebral V3 segment. But no other hemodynamically  significant stenosis. Electronically signed by: Helayne Hurst MD 04/03/2024 12:30 PM EST RP Workstation: [id number]    PROCEDURES and INTERVENTIONS:  .Critical Care  Performed by: Claudene Rover, MD Authorized by: Claudene Rover, MD   Critical care provider statement:    Critical care time (minutes):  30   Critical care time was exclusive of:  Separately billable procedures and treating other patients   Critical care was necessary to treat or prevent imminent or life-threatening deterioration of the following conditions:  CNS failure or compromise   Critical care was time spent personally by me on the following activities:  Development of treatment plan with patient or surrogate, discussions with consultants, evaluation of patient's response to treatment, examination of patient, ordering and review of laboratory studies, ordering and review of radiographic studies, ordering and performing treatments and interventions, pulse oximetry, re-evaluation of patient's condition and review of old charts .1-3 Lead EKG Interpretation  Performed by: Claudene Rover, MD Authorized by: Claudene Rover, MD     Interpretation: normal     ECG rate:  86   ECG rate assessment: normal     Rhythm: sinus  rhythm     Ectopy: none     Conduction: normal     Medications  sodium chloride flush (NS) 0.9 % injection 3 mL (3 mLs Intravenous Given 04/03/24 1220)  iohexol (OMNIPAQUE) 350 MG/ML injection 100 mL (100 mLs Intravenous Contrast Given 04/03/24 1219)  clopidogrel (PLAVIX) tablet 300 mg (300 mg Oral Given 04/03/24 1307)  aspirin tablet 325 mg (325 mg Oral Given 04/03/24 1307)     IMPRESSION / MDM / ASSESSMENT AND PLAN / ED COURSE  I reviewed the triage vital signs and the nursing notes.  Differential diagnosis includes, but is not limited to, ICH, ischemic CVA, polysubstance abuse, assault  {Patient presents with symptoms of an acute illness or injury that is potentially life-threatening.  Patient presents to the  ED with signs of an acute stroke.  Imaging with signs of acute versus subacute infarct and high-grade stenosis to the left sided M1.  Not a candidate for TNK.  High risk for neurovascular stenting.  Ultimately transferred to neuro ICU for close monitoring per neurologic recommendations.  Mild hypokalemia, mild microcytic anemia with a indication for transfusion.  Clinical Course as of 04/03/24 1521  Tue Apr 03, 2024  1237 NIH 8, facial droop, right arm and leg, decreased sensation [DS]  1304 reassessed.  I discussed with Dr. Matthews with neurology and she is trying to get the patient transferred to [facility] [DS]    Clinical Course User Index [DS] Claudene Rover, MD     FINAL CLINICAL IMPRESSION(S) / ED DIAGNOSES   Final diagnoses:  Aphasia  Cerebrovascular accident (CVA), unspecified mechanism (HCC)     Rx / DC Orders   ED Discharge Orders     None        Note:  This document was prepared using Dragon voice recognition software and may include unintentional dictation errors.   Claudene Rover, MD 04/03/24 [phone]

## 2024-04-03 NOTE — H&P (Signed)
 NEUROLOGY H&P NOTE   Date of service: April 03, 2024 Patient Name: Sheri Mendoza MRN:  [id number] DOB:  1981-03-20 Chief Complaint: Expressive and receptive aphasia  History of Present Illness  Sheri Mendoza is a 43 y.o. female with hx of hypertension and polysubstance abuse who presents with severe expressive aphasia which began yesterday evening.  She was last noted to be normal at 1930, and at 2030, her family noticed that she was having difficulty speaking.  They initially thought that this was due to intoxication and brought her to the hospital this morning when symptoms did not improve.  CT head demonstrates acute to subacute infarct in left anterior operculum, and severe left MCA M1 stenosis was seen on CT angiogram.  Patient was transferred here to be observed closely in the ICU for worsening and possible mechanical thrombectomy.   Last known well: 04/02/18/1930 Modified rankin score: 0-Completely asymptomatic and back to baseline post- stroke IV Thrombolysis: No, outside of window Thrombectomy: No, but will reconsider if patient worsens NIHSS components Score: Comment  1a Level of Conscious 0[x]  1[]  2[]  3[]      1b LOC Questions 0[]  1[]  2[x]       1c LOC Commands 0[x]  1[]  2[]       2 Best Gaze 0[x]  1[]  2[]       3 Visual 0[x]  1[]  2[]  3[]      4 Facial Palsy 0[x]  1[]  2[]  3[]      5a Motor Arm - left 0[x]  1[]  2[]  3[]  4[]  UN[]    5b Motor Arm - Right 0[x]  1[]  2[]  3[]  4[]  UN[]    6a Motor Leg - Left 0[x]  1[]  2[]  3[]  4[]  UN[]    6b Motor Leg - Right 0[]  1[x]  2[]  3[]  4[]  UN[]    7 Limb Ataxia 0[x]  1[]  2[]  UN[]      8 Sensory 0[x]  1[]  2[]  UN[]      9 Best Language 0[]  1[]  2[x]  3[]      10 Dysarthria 0[x]  1[]  2[]  UN[]      11 Extinct. and Inattention 0[x]  1[]  2[]       TOTAL:5       ROS   Unable to perform due to expressive and receptive aphasia  Past History   Past Medical History:  Diagnosis Date   Hypertension    Past Surgical History:  Procedure Laterality Date   TONSILLECTOMY      No family history on file. Social History   Socioeconomic History   Marital status: Single    Spouse name: Not on file   Number of children: Not on file   Years of education: Not on file   Highest education level: Not on file  Occupational History   Not on file  Tobacco Use   Smoking status: Every Day    Types: Cigarettes   Smokeless tobacco: Never  Substance and Sexual Activity   Alcohol use: Not Currently   Drug use: Never   Sexual activity: Not on file  Other Topics Concern   Not on file  Social History Narrative   Not on file   Social Drivers of Health   Financial Resource Strain: Not on file  Food Insecurity: Not on file  Transportation Needs: Not on file  Physical Activity: Not on file  Stress: Not on file  Social Connections: Not on file   No Known Allergies  Medications   Current Facility-Administered Medications:    Chlorhexidine Gluconate Cloth 2 % PADS 6 each, 6 each, Topical, Daily, Faylynn Stamos, MD, 6 each at 04/03/24 1519  Vitals   Vitals:   04/03/24 1500 04/03/24 1548  BP: (!) 199/92 (!) 225/113  Pulse: 72 79  Resp: 18 (!) 23  Temp: 98 F (36.7 C)   TempSrc: Oral   SpO2: 100% 99%     There is no height or weight on file to calculate BMI.  Physical Exam   Constitutional: Appears well-developed and well-nourished.  Psych: Affect appropriate to situation.  Eyes: No scleral injection.  HENT: No OP obstruction.  Head: Normocephalic.  Cardiovascular: Normal rate and regular rhythm.  Respiratory: Effort normal, non-labored breathing.  Skin: WDI.   Neurologic Examination    NEURO:  Mental Status: Patient is alert, responds to name and is able to follow single step commands intermittently and two-step commands with when they are pantomimed to her Speech/Language: speech is without dysarthria but with expressive and receptive aphasia, patient is able to say yes I do not know but no other words or phrases  Cranial Nerves:   II: PERRL.  Blinks to threat bilaterally III, IV, VI: EOMI. Eyelids elevate symmetrically.  VII: Smile is symmetrical.  VIII: hearing intact to voice. IX, X: Phonation is normal.  XII: tongue is midline without fasciculations. Motor: 5/5 strength to all muscle groups tested.  Some drift in right lower extremity Tone: is normal and bulk is normal Sensation- Intact to light touch bilaterally.  Coordination: FTN intact bilaterally Gait- deferred   Labs   CBC:  Recent Labs  Lab 04/03/24 1136  WBC 10.1  NEUTROABS 6.4  HGB 10.2*  HCT 35.9*  MCV 69.6*  PLT 332   Basic Metabolic Panel:  Lab Results  Component Value Date   NA 137 04/03/2024   K 3.2 (L) 04/03/2024   CO2 23 04/03/2024   GLUCOSE 107 (H) 04/03/2024   BUN 9 04/03/2024   CREATININE 0.61 04/03/2024   CALCIUM 9.3 04/03/2024   GFRNONAA >60 04/03/2024   Lipid Panel: No results found for: LDLCALC HgbA1c: No results found for: HGBA1C Urine Drug Screen: No results found for: LABOPIA, COCAINSCRNUR, LABBENZ, AMPHETMU, THCU, LABBARB  Alcohol Level     Component Value Date/Time   ETH <15 04/03/2024 1136   INR  Lab Results  Component Value Date   INR 0.9 04/03/2024   APTT  Lab Results  Component Value Date   APTT 28 04/03/2024     CT Head without contrast(Personally reviewed): Small acute or subacute cortical infarct in left anterior operculum, aspects 8  CT angio Head and Neck with contrast(Personally reviewed): Left MCA distal M1 occlusion or near occlusion with 37 mm Tmax greater than 4 seconds parameter abnormality on CT perfusion  MRI Brain(Personally reviewed): Pending   Assessment   Sheri Mendoza is a 43 y.o. female with history of hypertension and polysubstance abuse who presents with receptive and expressive aphasia which began yesterday evening around 2030.  Family initially thought that symptoms were due to intoxication and brought her to the hospital in the morning when symptoms  did not subside.  She was found on CT to have a small acute infarct in the left anterior operculum, and CT angiogram demonstrates severe stenosis of the left MCA M1 segment.  Case was initially discussed with interventional radiologist, and decision was made to watch patient closely and intervene if symptoms worsened.  As speech appears to have slightly worsened since patient's arrival in the unit, will keep head of bed flat, give 1 L normal saline bolus and start maintenance fluids to increase perfusion.  Low threshold to  call interventional radiology to reevaluate for intervention if symptoms worsen.  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   Secondary Diagnosis: Essential (primary) hypertension Polysubstance abuse  Recommendations  - Admit to ICU for close monitoring - Keep head of bed flat -1 L IV fluid bolus, begin maintenance fluids at 75 cc/h - Permissive HTN x48 hrs from sx onset BP <220/110. PRN labetalol or hydralazine if BP above these parameters. Avoid oral antihypertensives. - MRI brain wo contrast - CTA/MRA if not already obtained - TTE w/ bubble - Check A1c and LDL + add statin per guidelines - Aspirin 81 mg daily and Plavix 75 mg daily antiplt/anticoag, loading doses given at outside hospital - Hourly vital signs and NIHSS - STAT head CT for any change in neuro exam - Tele - PT/OT/SLP tomorrow - Stroke education - Amb referral to neurology upon discharge   ______________________________________________________________________ Patient seen by NP with MD, MD to edit note as needed.  Signed, Sheri Mendoza Clint Kill, NP [facility]   NEUROHOSPITALIST ADDENDUM Performed a face to face diagnostic evaluation.   I have reviewed the contents of history and physical exam as documented by PA/ARNP/Resident and agree with above documentation.  I have discussed and formulated the above plan as documented.  Edits to the note have been made as needed.  Impression/Key exam findings/Plan: has a combination of expressive greater than receptive aphasia with mild R leg drift on exam. Initially was somewhat worse but once the head of bed was flat and she was given fluids bolus, exam consistent with what was noted at [facility].  Plan is close monitoring in the Neuro ICU overnight with head of bed flat and fluids at 150cc/hr and continued DAPT. Will get MRI Brain tomorrow AM. If significant decline in NIHSS, will need discussion with Neuro IR.  This patient is critically ill and at significant risk of neurological worsening, death and care requires constant monitoring of vital signs, hemodynamics,respiratory and cardiac monitoring, neurological assessment, discussion with family, other specialists and medical decision making of high complexity. I spent 40 minutes of neurocritical care time  in the care of  this patient. This was time spent independent of any time provided by nurse practitioner or PA.  Willene Holian [facility] 04/03/2024  5:11 PM   Vaughn Frieze, MD [facility] [phone]   If 7pm to 7am, please call on call as listed on AMION.

## 2024-04-03 NOTE — ED Triage Notes (Signed)
 Pt with expressive aphasia, R facial droop, and sensation deficits. LKW 1930 yesterday.

## 2024-04-04 ENCOUNTER — Inpatient Hospital Stay (HOSPITAL_COMMUNITY)

## 2024-04-04 DIAGNOSIS — I509 Heart failure, unspecified: Secondary | ICD-10-CM

## 2024-04-04 DIAGNOSIS — I110 Hypertensive heart disease with heart failure: Secondary | ICD-10-CM | POA: Diagnosis not present

## 2024-04-04 DIAGNOSIS — I7090 Unspecified atherosclerosis: Secondary | ICD-10-CM | POA: Diagnosis not present

## 2024-04-04 DIAGNOSIS — I63512 Cerebral infarction due to unspecified occlusion or stenosis of left middle cerebral artery: Secondary | ICD-10-CM

## 2024-04-04 DIAGNOSIS — R29706 NIHSS score 6: Secondary | ICD-10-CM | POA: Diagnosis not present

## 2024-04-04 DIAGNOSIS — F17210 Nicotine dependence, cigarettes, uncomplicated: Secondary | ICD-10-CM | POA: Diagnosis not present

## 2024-04-04 DIAGNOSIS — F1210 Cannabis abuse, uncomplicated: Secondary | ICD-10-CM

## 2024-04-04 DIAGNOSIS — I6389 Other cerebral infarction: Secondary | ICD-10-CM | POA: Diagnosis not present

## 2024-04-04 DIAGNOSIS — I69391 Dysphagia following cerebral infarction: Secondary | ICD-10-CM | POA: Diagnosis not present

## 2024-04-04 DIAGNOSIS — F1410 Cocaine abuse, uncomplicated: Secondary | ICD-10-CM

## 2024-04-04 DIAGNOSIS — E785 Hyperlipidemia, unspecified: Secondary | ICD-10-CM | POA: Diagnosis not present

## 2024-04-04 DIAGNOSIS — G936 Cerebral edema: Secondary | ICD-10-CM | POA: Diagnosis not present

## 2024-04-04 DIAGNOSIS — I63522 Cerebral infarction due to unspecified occlusion or stenosis of left anterior cerebral artery: Secondary | ICD-10-CM | POA: Diagnosis not present

## 2024-04-04 LAB — HEMOGLOBIN A1C
Hgb A1c MFr Bld: 5.2 % (ref 4.8–5.6)
Mean Plasma Glucose: 103 mg/dL

## 2024-04-04 LAB — CBC
HCT: 32.1 % — ABNORMAL LOW (ref 36.0–46.0)
Hemoglobin: 9.1 g/dL — ABNORMAL LOW (ref 12.0–15.0)
MCH: 19.8 pg — ABNORMAL LOW (ref 26.0–34.0)
MCHC: 28.3 g/dL — ABNORMAL LOW (ref 30.0–36.0)
MCV: 69.9 fL — ABNORMAL LOW (ref 80.0–100.0)
Platelets: 258 K/uL (ref 150–400)
RBC: 4.59 MIL/uL (ref 3.87–5.11)
RDW: 19 % — ABNORMAL HIGH (ref 11.5–15.5)
WBC: 7.4 K/uL (ref 4.0–10.5)
nRBC: 0 % (ref 0.0–0.2)

## 2024-04-04 LAB — ECHOCARDIOGRAM COMPLETE
Calc EF: 67.5 %
Single Plane A2C EF: 72 %
Single Plane A4C EF: 63.8 %

## 2024-04-04 LAB — COMPREHENSIVE METABOLIC PANEL WITH GFR
ALT: 11 U/L (ref 0–44)
AST: 15 U/L (ref 15–41)
Albumin: 3.3 g/dL — ABNORMAL LOW (ref 3.5–5.0)
Alkaline Phosphatase: 55 U/L (ref 38–126)
Anion gap: 12 (ref 5–15)
BUN: 6 mg/dL (ref 6–20)
CO2: 22 mmol/L (ref 22–32)
Calcium: 8.6 mg/dL — ABNORMAL LOW (ref 8.9–10.3)
Chloride: 105 mmol/L (ref 98–111)
Creatinine, Ser: 0.57 mg/dL (ref 0.44–1.00)
GFR, Estimated: >60 mL/min (ref >60)
Glucose, Bld: 97 mg/dL (ref 70–99)
Potassium: 3.2 mmol/L — ABNORMAL LOW (ref 3.5–5.1)
Sodium: 139 mmol/L (ref 135–145)
Total Bilirubin: 0.8 mg/dL (ref 0.0–1.2)
Total Protein: 6.2 g/dL — ABNORMAL LOW (ref 6.5–8.1)

## 2024-04-04 LAB — LIPID PANEL
Cholesterol: 153 mg/dL (ref 0–200)
HDL: 25 mg/dL — ABNORMAL LOW (ref >40)
LDL Cholesterol: 111 mg/dL — ABNORMAL HIGH (ref 0–99)
Total CHOL/HDL Ratio: 6.1 ratio
Triglycerides: 87 mg/dL (ref <150)
VLDL: 17 mg/dL (ref 0–40)

## 2024-04-04 LAB — HIV ANTIBODY (ROUTINE TESTING W REFLEX): HIV Screen 4th Generation wRfx: NONREACTIVE

## 2024-04-04 MED ORDER — LORAZEPAM 2 MG/ML IJ SOLN
INTRAMUSCULAR | Status: AC
  Administered 2024-04-04: 2 mg via INTRAVENOUS
  Filled 2024-04-04: qty 1

## 2024-04-04 MED ORDER — LORAZEPAM 2 MG/ML IJ SOLN: 2.0000 mg | Freq: Once | INTRAMUSCULAR | Status: AC

## 2024-04-04 MED ORDER — ATORVASTATIN CALCIUM 80 MG PO TABS
80.0000 mg | ORAL_TABLET | Freq: Every day | ORAL | Status: DC
  Administered 2024-04-04 – 2024-04-06 (×3): 80 mg via ORAL
  Filled 2024-04-04 (×3): qty 1

## 2024-04-04 MED ORDER — POTASSIUM CHLORIDE CRYS ER 20 MEQ PO TBCR
40.0000 meq | EXTENDED_RELEASE_TABLET | ORAL | Status: AC
  Administered 2024-04-04 (×2): 40 meq via ORAL
  Filled 2024-04-04 (×2): qty 2

## 2024-04-04 NOTE — Discharge Instructions (Signed)
 Dear Bascom HERO Kemmer,   Congratulations for your interest in quitting smoking!  Find a program that suits you best: when you want to quit, how you need support, where you live, and how you like to learn.    If you're ready to get started TODAY, consider scheduling a visit through [email or web address].  Appointments are available from 8am to 8pm, Monday to Friday.   Most health insurance plans will cover some level of tobacco cessation visits and medications.    Additional Resources: [employer] are also available to help you quit & provide the support you'll need. Many programs are available in both English and Spanish and have a long history of successfully helping people get off and stay off tobacco.    Quit Smoking Apps:  quitSTART at seriousbroker.de QuitGuide?at forgetparking.dk Online education and resources: Smokefree  at borders group.gov Free Telephone Coaching: QuitNow,  Call 1-800-QUIT-NOW (705-249-7148) or Text- Ready to 424-065-1526 *Quitline  has teamed up with Medicaid to offer a free 14 week program    Vaping- Want to Quit? Free 24/7 support. Call [facility]  [place], [place], [place], [place], [place]  [facility]

## 2024-04-04 NOTE — Evaluation (Signed)
 Speech Language Pathology Evaluation Patient Details Name: Sheri Mendoza MRN: [id number] DOB: 1981-03-14 Today's Date: 04/04/2024 Time: 1300-1330 SLP Time Calculation (min) (ACUTE ONLY): 30 min  Problem List:  Patient Active Problem List   Diagnosis Date Noted   Acute ischemic left MCA stroke (HCC) 04/03/2024   Past Medical History:  Past Medical History:  Diagnosis Date   Hypertension    Past Surgical History:  Past Surgical History:  Procedure Laterality Date   TONSILLECTOMY     HPI:  Sheri Mendoza is a 43 year old female admitted with aphasia and right sided weakness. CT head showed small acute or subacute cortical infarct in the left anterior operculum and more subtle age-indeterminate hypodensity near the genu of the left internal capsule. PMH of HTN, multisubstance abuse (crack, meth per family)   Assessment / Plan / Recommendation Clinical Impression  Patient present with global aphasia impacting both receptive and expressive communication including reading and writing. She is receptive to phonemic and sentence completion cues. Difficult to assess cognition based on degree of aphasia, particularly receptive language, however for tasks assessed today appeared largely [facility]. Will continue to assess.    SLP Assessment  SLP Recommendation/Assessment: Patient needs continued Speech Language Pathology Services SLP Visit Diagnosis: Aphasia (R47.01)     Assistance Recommended at Discharge  Frequent or constant Supervision/Assistance  Functional Status Assessment Patient has had a recent decline in their functional status and demonstrates the ability to make significant improvements in function in a reasonable and predictable amount of time.  Frequency and Duration min 2x/week  2 weeks      SLP Evaluation Cognition  Overall Cognitive Status: Difficult to assess (due to degree of aphasia) Arousal/Alertness: Awake/alert Orientation Level: Oriented to person;Oriented to  place Comments: appears WFL based on tasks assessed, interactions with therapists, engagement in tasks, and appropriate emotional response to questions.       Comprehension  Auditory Comprehension Overall Auditory Comprehension: Impaired Yes/No Questions: Impaired Basic Biographical Questions: 76-100% accurate Commands: Impaired One Step Basic Commands: 75-100% accurate EffectiveTechniques: Visual/Gestural cues Visual Recognition/Discrimination Discrimination: Exceptions to [facility] Common Objects: Unable to indentify (50% accuracy) Reading Comprehension Reading Status: Impaired Word level: Impaired    Expression Expression Primary Mode of Expression: Verbal Verbal Expression Overall Verbal Expression: Impaired Initiation: Impaired Automatic Speech: Name;Social Response;Counting;Day of week Level of Generative/Spontaneous Verbalization: Phrase Repetition: Impaired Level of Impairment: Word level Naming: Impairment Responsive: 0-25% accurate Confrontation: Impaired Common Objects: Unable to indentify (50% accuracy) Convergent: 0-24% accurate Divergent: 0-24% accurate Verbal Errors: Semantic paraphasias;Aware of errors Pragmatics: No impairment Written Expression Dominant Hand: Right Written Expression: Exceptions to [facility] Copy Ability: Letter (impaired) Dictation Ability: Letter (impaired) Self Formulation Ability: Word (impaired)   Oral / Motor  Oral Motor/Sensory Function Overall Oral Motor/Sensory Function: Within functional limits Motor Speech Overall Motor Speech: Appears within functional limits for tasks assessed           Rea Pass MA, CCC-SLP  [name] 04/04/2024, 1:56 PM

## 2024-04-04 NOTE — Progress Notes (Addendum)
 STROKE TEAM PROGRESS NOTE    SIGNIFICANT HOSPITAL EVENTS 12/2: Presented with severe expressive aphasia  CTH: acute to subacute infarct in left anterior operculum CTA: and severe left MCA M1 stenosis  Transferred to [facility] for close monitoring and low threshold to call interventional radiology to reevaluate for intervention if symptoms worsen.   INTERIM HISTORY/SUBJECTIVE No family at the bedside. Patient with expressive aphasia, right facial, can follow simple commands eight leg with drift.   MRI brain with Patchy acute infarcts throughout the left MCA territory.  Urine drug screen positive for cocaine and THC urine drug screen  CBC    Component Value Date/Time   WBC 7.4 04/04/2024 0253   RBC 4.59 04/04/2024 0253   HGB 9.1 (L) 04/04/2024 0253   HCT 32.1 (L) 04/04/2024 0253   PLT 258 04/04/2024 0253   MCV 69.9 (L) 04/04/2024 0253   MCH 19.8 (L) 04/04/2024 0253   MCHC 28.3 (L) 04/04/2024 0253   RDW 19.0 (H) 04/04/2024 0253   LYMPHSABS 2.4 04/03/2024 1136   MONOABS 0.6 04/03/2024 1136   EOSABS 0.5 04/03/2024 1136   BASOSABS 0.1 04/03/2024 1136    BMET    Component Value Date/Time   NA 139 04/04/2024 0253   K 3.2 (L) 04/04/2024 0253   CL 105 04/04/2024 0253   CO2 22 04/04/2024 0253   GLUCOSE 97 04/04/2024 0253   BUN 6 04/04/2024 0253   CREATININE 0.57 04/04/2024 0253   CALCIUM 8.6 (L) 04/04/2024 0253   GFRNONAA >60 04/04/2024 0253    IMAGING past 24 hours MR BRAIN WO CONTRAST Result Date: 04/04/2024 EXAM: MRI BRAIN WITHOUT CONTRAST 04/04/2024 10:18:00 AM TECHNIQUE: Multiplanar multisequence MRI of the head/brain was performed without the administration of intravenous contrast. COMPARISON: Head CT and CTA 04/03/2024. CLINICAL HISTORY: Stroke, follow up. Aphasia. FINDINGS: LIMITATIONS/ARTIFACTS: The examination is intermittently up to moderately motion degraded. BRAIN AND VENTRICLES: There are patchy acute infarcts throughout the left MCA territory, overall moderate in volume.  The largest confluent infarct measures 3 cm in the left frontal lobe at and just above the level of the operculum. Additional smaller acute infarcts are scattered throughout cortex and white matter in the MCA territory including in the internal capsule. There is associated cytotoxic edema at the larger infarcts without significant mass effect or associated hemorrhage. Chronic lacunar infarcts are noted in the left internal capsule and left thalamus with chronic blood products associated with the latter. There is a small chronic left cerebellar infarct. No mass, midline shift, hydrocephalus, or extra-axial fluid collection is evident. Overall cerebral volume is within normal limits for age. Major intracranial vascular flow voids are preserved. ORBITS: No acute abnormality. SINUSES AND MASTOIDS: Small bilateral mastoid effusions. Minimal mucosal thickening in the ethmoid sinuses. BONES AND SOFT TISSUES: Normal marrow signal. No acute soft tissue abnormality. IMPRESSION: 1. Patchy acute infarcts throughout the left MCA territory. 2. Chronic lacunar infarcts in the left internal capsule, left thalamus, and cerebellum. Electronically signed by: Dasie Hamburg MD 04/04/2024 10:58 AM EST RP Workstation: [id number]   CT ANGIO HEAD NECK W WO CM W PERF (CODE STROKE) Result Date: 04/03/2024 EXAM: CT ANGIOGRAPHY OF THE HEAD AND NECK CT PERFUSION BRAIN 09/15/2022 TECHNIQUE: Contiguous axial images were obtained from the base of the skull through the vertex without intravenous contrast. Multidetector CT imaging of the head and neck was performed using the standard protocol during bolus administration of intravenous contrast. 3D postprocessing with multiplanar reconstructions and MIPs was performed to evaluate the vascular anatomy. Carotid stenosis measurements (  when applicable) are obtained utilizing NASCET criteria, using the distal internal carotid diameter as the denominator. Cerebral perfusion analysis using computed  tomography with contrast administration, including post-processing of parametric maps with determination of cerebral blood flow, cerebral blood volume, mean transit time and time-to-maximum. RADIATION DOSE REDUCTION: This exam was performed according to the departmental dose-optimization program which includes automated exposure control, adjustment of the mA and/or kV according to patient size and/or use of iterative reconstruction technique. CONTRAST: Intravenous contrast was administered. COMPARISON: None Available. CLINICAL HISTORY: 43 year old female, code stroke presentation. FINDINGS: CTA NECK: AORTIC ARCH AND ARCH VESSELS: Normal 3 vessel aortic arch. No dissection or arterial injury. No significant stenosis of the brachiocephalic or subclavian arteries. Proximal right subclavian artery and right vertebral artery V1 and V2 segments appear patent and normal. The right vertebral artery is somewhat dominant. Proximal left subclavian artery soft and calcified plaque without stenosis. Normal left vertebral artery origin. Nondominant left vertebral artery is diminutive but patent and within limits to the vertebrobasilar junction. Normal left PICA origin. CERVICAL CAROTID ARTERIES: Patent right carotid artery through the right ICA siphon with no significant atherosclerosis or stenosis. Mild tortuosity. Normal right posterior communicating artery origin. Left carotid bifurcation soft and calcified atherosclerotic plaque with no associated stenosis. Patent left ICA with mild tortuosity below the skull base. Left ICA siphon is patent with mild to moderate calcified plaque in the cavernous segment, but no convincing left ICA stenosis. Left posterior communicating artery origin appears normal. No dissection or arterial injury. CERVICAL VERTEBRAL ARTERIES: Right vertebral artery V1 and V2 segments appear patent and normal. The right vertebral artery is somewhat dominant. There is mild to moderate calcified plaque in the  right vertebral artery V3 segment with only mild associated stenosis. Right vertebral artery V4 segment and vertebral basilar junction are normal. Normal left vertebral artery origin. Nondominant left vertebral artery is diminutive but patent and within limits to the vertebrobasilar junction. Normal left PICA origin. No dissection or arterial injury. LUNGS AND MEDIASTINUM: Negative visible upper chest. SOFT TISSUES: Nonvascular neck soft tissue spaces are within normal limits. BONES: No acute abnormality. CTA HEAD: ANTERIOR CIRCULATION: No significant stenosis of the internal carotid arteries. Patent MCA and ACA origins. The right ACA A1 segment is dominant, the left is diminutive. The anterior communicating artery is ectatic, without discrete aneurysm. Bilateral ACA branches are within normal limits. Left MCA M1 segment is occluded. No aneurysm. POSTERIOR CIRCULATION: Patent basilar artery without stenosis. SCA origins appear patent. There are fetal type bilateral PCA origins. Bilateral PCA branches are symmetric and within normal limits. Right vertebral artery V4 segment and vertebral basilar junction are normal. Nondominant left vertebral artery is diminutive but patent and within limits to the vertebrobasilar junction. No aneurysm. OTHER: No dural venous sinus thrombosis on this non-dedicated study. CT PERFUSION: EXAM QUALITY: Exam quality is adequate with diagnostic perfusion maps. No significant motion artifact. Appropriate arterial inflow and venous outflow curves. CORE INFARCT (CBF<30% volume): 0 mL TOTAL HYPOPERFUSION (Tmax>6s volume): 0 mL PENUMBRA: not applicable Location: not applicable ADDITIONAL PERFUSION FINDINGS: No cerebral blood flow or cerebral blood volume perfusion parameter changes. However, 37 mL of Tmax greater than 4 s volume is detected in the left MCA territory. IMPRESSION: 1. CTA with Left MCA distal M1 occlusion/near occlusion, strongly suggesting ELVO. But with only Tmax >4 seconds  parameter abnormality detected on CT Perfusion (37 mL). Plain Head CT images are still pending at this time. 2. Study discussed by telephone with Dr Matthews at 1220 hours  on 04/03/2024. 3. Mild to moderate upstream Right carotid (primarily RICA siphon) atherosclerosis. And also calcified plaque in the Right vertebral V3 segment. But no other hemodynamically significant stenosis. Electronically signed by: Helayne Hurst MD 04/03/2024 12:30 PM EST RP Workstation: [id number]    Vitals:   04/04/24 0600 04/04/24 0630 04/04/24 0700 04/04/24 0800  BP: (!) 176/107 (!) 189/96 (!) 175/84 (!) 153/69  Pulse: 73 84 78 73  Resp: 19 17 16 18   Temp:    98 F (36.7 C)  TempSrc:    Oral  SpO2: 99% 98% 98% 98%     PHYSICAL EXAM General:  Alert, well-nourished, well-developed patient in no acute distress Psych:  Mood and affect appropriate for situation CV: Regular rate and rhythm on monitor Respiratory:  Regular, unlabored respirations on room air GI: Abdomen soft and nontender   NEURO:  Mental Status: AA&Ox self and hospital. Expressive aphasia, mild dysarthria able to follow commands  Cranial Nerves:  II: PERRL. Visual fields full.  III, IV, VI: EOMI. Eyelids elevate symmetrically.  V: Sensation is intact to light touch and symmetrical to face.  VII: right facial droop VIII: hearing intact to voice. IX, X: Palate elevates symmetrically. Phonation is normal.  KP:Dynloizm shrug 5/5. XII: tongue is midline without fasciculations. Motor: 5/5 strength bilateral uppers, and left lower, right lower with drift  Tone: is normal and bulk is normal Sensation- Intact to light touch bilaterally. Extinction absent to light touch to DSS.   Coordination: FTN intact bilaterally, HKS: no ataxia in BLE.No drift.  Gait- deferred  Most Recent NIH   1a Level of Conscious.:  1b LOC Questions: 1 1c LOC Commands:  2 Best Gaze:  3 Visual:  4 Facial Palsy: 1 5a Motor Arm - left:  5b Motor Arm - Right:  6a Motor Leg  - Left:  6b Motor Leg - Right: 1 7 Limb Ataxia:  8 Sensory:  9 Best Language: 2 10 Dysarthria: 1 11 Extinct. and Inatten.:  TOTAL: 6   ASSESSMENT/PLAN  Sheri Mendoza is a 43 y.o. female with history of  hypertension and polysubstance abuse who presents with severe expressive aphasia which began yesterday evening. NIH on Admission 5  Acute Ischemic Infarct:  left MCA, patchy   Etiology:  high grade intracranial stenosis   Code Stroke CT head Small acute or subacute cortical infarct in the left anterior operculum (2cm), and more subtle age-indeterminate hypodensity near the genu of the left internal capsule. ASPECTS 8. CTA head & neck Left MCA distal M1 occlusion/near occlusion.  Mild to moderate upstream Right carotid (primarily RICA siphon) atherosclerosis. And also calcified plaque in the Right vertebral V3 segment. CT perfusion 37 mL of Tmax greater than 4 s volume is detected in the left MCA territory. MRI: 1. Patchy acute infarcts throughout the left MCA territory. 2. Chronic lacunar infarcts in the left internal capsule, left thalamus, and cerebellum. 2D Echo EF 65-70% severe concentric  LVH with Grade I diastolic dysfunction  LDL 111 [id number] pending  UDS positive for cocaine and THC  VTE prophylaxis - lovenox  No antithrombotic prior to admission, now on aspirin 81 mg daily and clopidogrel 75 mg daily for 3 months and then ASA alone. Therapy recommendations:  Pending Disposition:  pending   Hypertension Home meds:  amlodipine  5 mg  Stable Blood Pressure Goal: BP less than 220/110   Hyperlipidemia Home meds:  none LDL 111, goal < 70 Add atorvastatin 80mg    Continue statin at discharge  Tobacco  Abuse Patient smokes 1/2 packs per day f       Ready to quit? Yes Nicotine replacement therapy provided  Substance Abuse Patient uses THC and cocaine UDS positive for  THC  Cocaine       Ready to quit? Yes TOC consult for cessation placed  Dysphagia Patient has  post-stroke dysphagia, SLP consulted    Diet   Diet Heart Room service appropriate? Yes with Assist; Fluid consistency: Thin   Advance diet as tolerated  Hypokalemia  K 3.2 replaced  Recheck in am   Other Stroke Risk Factors ETOH use, alcohol level <15, advised to drink no more than 1 drink(s) a day Congestive heart failure  Hospital day # 1   Karna Geralds DNP, ACNPC-AG  [facility]  I have personally obtained history,examined this patient, reviewed notes, independently viewed imaging studies, participated in medical decision making and plan of care.ROS completed by me personally and pertinent positives fully documented  I have made any additions or clarifications directly to the above note. Agree with note above.  Patient does not wear aphasia due to left MCA branch infarct due to severe left M1 stenosis.  Continue close neurological observation and mild permissive hypertension.  Dual antiplatelet therapy aspirin and Plavix.  Patient counseled to quit smoking cigarettes, using marijuana and cocaine.  Continue ongoing stroke workup.  Aggressive risk factor modification.  If patient has significant neurological worsening may need to repeat CT angiogram and perfusion to consider emergent left MCA revascularization.  No family at the bedside. This patient is critically ill and at significant risk of neurological worsening, death and care requires constant monitoring of vital signs, hemodynamics,respiratory and cardiac monitoring, extensive review of multiple databases, frequent neurological assessment, discussion with family, other specialists and medical decision making of high complexity.I have made any additions or clarifications directly to the above note.This critical care time does not reflect procedure time, or teaching time or supervisory time of PA/NP/Med Resident etc but could involve care discussion time.  I spent 30 minutes of neurocritical care time  in the care of  this  patient.     Eather Popp, MD Medical Director [facility] Pager: [phone] 04/04/2024 3:12 PM   To contact Stroke Continuity provider, please refer to [email or web address]. After hours, contact General Neurology

## 2024-04-04 NOTE — TOC CM/SW Note (Signed)
 Transition of Care [facility]) - Inpatient Brief Assessment   Patient Details  Name: Sheri Mendoza MRN: [id number] Date of Birth: [birth year]  Transition of Care [facility] CM/SW Contact:    Bentlee Drier M, RN Phone Number: 04/04/2024, 5:13 PM   Clinical Narrative: Pt is a 43 y.o. female who presented 04/03/24 with aphasia. 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 PMH: HTN, polysubstance abuse PT/OT and ST recommending CIR and consult requested. Will follow as patient progresses.    Transition of Care Asessment: Insurance and Status: Insurance coverage has been reviewed Patient has primary care physician: No Home environment has been reviewed: Lives with adult children Prior level of function:: Independent; current PSA Prior/Current Home Services: No current home services Social Drivers of Health Review: SDOH reviewed needs interventions Readmission risk has been reviewed: Yes Transition of care needs: transition of care needs identified, TOC will continue to follow  Mliss MICAEL Fass, RN, BSN  Trauma/Neuro ICU Case Manager [phone]

## 2024-04-04 NOTE — Progress Notes (Signed)
 Echocardiogram 2D Echocardiogram has been performed.  Sheri Mendoza 04/04/2024, 9:24 AM

## 2024-04-04 NOTE — Progress Notes (Incomplete)
 STROKE TEAM PROGRESS NOTE    SIGNIFICANT HOSPITAL EVENTS  12/2: Presented with severe expressive aphasia   CTH: acute to subacute infarct in left anterior operculum CTA: and severe left MCA M1 stenosis  Transferred to [facility] for close monitoring and low threshold to call interventional radiology to reevaluate for intervention if symptoms worsen.   INTERIM HISTORY/SUBJECTIVE  ***  OBJECTIVE  CBC    Component Value Date/Time   WBC 7.4 04/04/2024 0253   RBC 4.59 04/04/2024 0253   HGB 9.1 (L) 04/04/2024 0253   HCT 32.1 (L) 04/04/2024 0253   PLT 258 04/04/2024 0253   MCV 69.9 (L) 04/04/2024 0253   MCH 19.8 (L) 04/04/2024 0253   MCHC 28.3 (L) 04/04/2024 0253   RDW 19.0 (H) 04/04/2024 0253   LYMPHSABS 2.4 04/03/2024 1136   MONOABS 0.6 04/03/2024 1136   EOSABS 0.5 04/03/2024 1136   BASOSABS 0.1 04/03/2024 1136    BMET    Component Value Date/Time   NA 139 04/04/2024 0253   K 3.2 (L) 04/04/2024 0253   CL 105 04/04/2024 0253   CO2 22 04/04/2024 0253   GLUCOSE 97 04/04/2024 0253   BUN 6 04/04/2024 0253   CREATININE 0.57 04/04/2024 0253   CALCIUM 8.6 (L) 04/04/2024 0253   GFRNONAA >60 04/04/2024 0253    IMAGING past 24 hours CT HEAD CODE STROKE WO CONTRAST Result Date: 04/03/2024 EXAM: CT HEAD WITHOUT CONTRAST 04/03/2024 11:42:28 AM TECHNIQUE: CT of the head was performed without the administration of intravenous contrast. Automated exposure control, iterative reconstruction, and/or weight based adjustment of the mA/kV was utilized to reduce the radiation dose to as low as reasonably achievable. COMPARISON: Head CT 11/29/2006. CLINICAL HISTORY: 43 year old female with acute neuro deficit, stroke suspected. FINDINGS: BRAIN AND VENTRICLES: Brain volume remains normal for age. No acute hemorrhage. No hydrocephalus. No extra-axial collection. No mass effect or midline shift. Abnormal cortical hypodensity left anterior operculum on series 2 image 16. This appears to be a fairly small  area of roughly 2 cm involvement. This has an acute or subacute appearance. Asymmetric hypodensity near the genu of the left internal capsule on series 2 image 14 is also new but age indeterminate. Elsewhere in the left MCA territory gray white differentiation appears maintained. Outside of the left MCA territory normal gray white differentiation. No suspicious intracranial vascular hyperdensity. ORBITS: No acute abnormality. SINUSES: Paranasal sinuses, middle ears and mastoids remain well aerated. SOFT TISSUES AND SKULL: No acute soft tissue abnormality. No skull fracture. There is slight rightward gaze. alberta stroke program early CT score (aspects) ----- Ganglionic (caudate, ic, lentiform nucleus, insula, M1-m3): 6 Supraganglionic (m4-m6): 2 Total: 8 IMPRESSION: 1. Small acute or subacute cortical infarct in the left anterior operculum (2 cm), and more subtle age-indeterminate hypodensity near the genu of the left internal capsule. ASPECTS 8. 2. No acute intracranial hemorrhage or mass effect. 3. This was discussed by telephone with Dr Matthews at 1233 hours on 04/03/2024. See also CTA and CTP reported prior to this exam. Electronically signed by: Helayne Hurst MD 04/03/2024 12:36 PM EST RP Workstation: [id number]   CT ANGIO HEAD NECK W WO CM W PERF (CODE STROKE) Result Date: 04/03/2024 EXAM: CT ANGIOGRAPHY OF THE HEAD AND NECK CT PERFUSION BRAIN 09/15/2022 TECHNIQUE: Contiguous axial images were obtained from the base of the skull through the vertex without intravenous contrast. Multidetector CT imaging of the head and neck was performed using the standard protocol during bolus administration of intravenous contrast. 3D postprocessing with multiplanar reconstructions and  MIPs was performed to evaluate the vascular anatomy. Carotid stenosis measurements (when applicable) are obtained utilizing NASCET criteria, using the distal internal carotid diameter as the denominator. Cerebral perfusion analysis using computed  tomography with contrast administration, including post-processing of parametric maps with determination of cerebral blood flow, cerebral blood volume, mean transit time and time-to-maximum. RADIATION DOSE REDUCTION: This exam was performed according to the departmental dose-optimization program which includes automated exposure control, adjustment of the mA and/or kV according to patient size and/or use of iterative reconstruction technique. CONTRAST: Intravenous contrast was administered. COMPARISON: None Available. CLINICAL HISTORY: 43 year old female, code stroke presentation. FINDINGS: CTA NECK: AORTIC ARCH AND ARCH VESSELS: Normal 3 vessel aortic arch. No dissection or arterial injury. No significant stenosis of the brachiocephalic or subclavian arteries. Proximal right subclavian artery and right vertebral artery V1 and V2 segments appear patent and normal. The right vertebral artery is somewhat dominant. Proximal left subclavian artery soft and calcified plaque without stenosis. Normal left vertebral artery origin. Nondominant left vertebral artery is diminutive but patent and within limits to the vertebrobasilar junction. Normal left PICA origin. CERVICAL CAROTID ARTERIES: Patent right carotid artery through the right ICA siphon with no significant atherosclerosis or stenosis. Mild tortuosity. Normal right posterior communicating artery origin. Left carotid bifurcation soft and calcified atherosclerotic plaque with no associated stenosis. Patent left ICA with mild tortuosity below the skull base. Left ICA siphon is patent with mild to moderate calcified plaque in the cavernous segment, but no convincing left ICA stenosis. Left posterior communicating artery origin appears normal. No dissection or arterial injury. CERVICAL VERTEBRAL ARTERIES: Right vertebral artery V1 and V2 segments appear patent and normal. The right vertebral artery is somewhat dominant. There is mild to moderate calcified plaque in the  right vertebral artery V3 segment with only mild associated stenosis. Right vertebral artery V4 segment and vertebral basilar junction are normal. Normal left vertebral artery origin. Nondominant left vertebral artery is diminutive but patent and within limits to the vertebrobasilar junction. Normal left PICA origin. No dissection or arterial injury. LUNGS AND MEDIASTINUM: Negative visible upper chest. SOFT TISSUES: Nonvascular neck soft tissue spaces are within normal limits. BONES: No acute abnormality. CTA HEAD: ANTERIOR CIRCULATION: No significant stenosis of the internal carotid arteries. Patent MCA and ACA origins. The right ACA A1 segment is dominant, the left is diminutive. The anterior communicating artery is ectatic, without discrete aneurysm. Bilateral ACA branches are within normal limits. Left MCA M1 segment is occluded. No aneurysm. POSTERIOR CIRCULATION: Patent basilar artery without stenosis. SCA origins appear patent. There are fetal type bilateral PCA origins. Bilateral PCA branches are symmetric and within normal limits. Right vertebral artery V4 segment and vertebral basilar junction are normal. Nondominant left vertebral artery is diminutive but patent and within limits to the vertebrobasilar junction. No aneurysm. OTHER: No dural venous sinus thrombosis on this non-dedicated study. CT PERFUSION: EXAM QUALITY: Exam quality is adequate with diagnostic perfusion maps. No significant motion artifact. Appropriate arterial inflow and venous outflow curves. CORE INFARCT (CBF<30% volume): 0 mL TOTAL HYPOPERFUSION (Tmax>6s volume): 0 mL PENUMBRA: not applicable Location: not applicable ADDITIONAL PERFUSION FINDINGS: No cerebral blood flow or cerebral blood volume perfusion parameter changes. However, 37 mL of Tmax greater than 4 s volume is detected in the left MCA territory. IMPRESSION: 1. CTA with Left MCA distal M1 occlusion/near occlusion, strongly suggesting ELVO. But with only Tmax >4 seconds  parameter abnormality detected on CT Perfusion (37 mL). Plain Head CT images are still pending at this time.  2. Study discussed by telephone with Dr Matthews at 1220 hours on 04/03/2024. 3. Mild to moderate upstream Right carotid (primarily RICA siphon) atherosclerosis. And also calcified plaque in the Right vertebral V3 segment. But no other hemodynamically significant stenosis. Electronically signed by: Helayne Hurst MD 04/03/2024 12:30 PM EST RP Workstation: [id number]    Vitals:   04/04/24 0600 04/04/24 0630 04/04/24 0700 04/04/24 0800  BP: (!) 176/107 (!) 189/96 (!) 175/84 (!) 153/69  Pulse: 73 84 78 73  Resp: 19 17 16 18   Temp:      TempSrc:      SpO2: 99% 98% 98% 98%     PHYSICAL EXAM General:  Alert, well-nourished, well-developed patient in no acute distress Psych:  Mood and affect appropriate for situation CV: Regular rate and rhythm on monitor Respiratory:  Regular, unlabored respirations on room air GI: Abdomen soft and nontender   NEURO:  Mental Status: AA&Ox3, patient is able to give clear and coherent history Speech/Language: speech is without dysarthria or aphasia.  Naming, repetition, fluency, and comprehension intact.  Cranial Nerves:  II: PERRL. Visual fields full.  III, IV, VI: EOMI. Eyelids elevate symmetrically.  V: Sensation is intact to light touch and symmetrical to face.  VII: Face is symmetrical resting and smiling VIII: hearing intact to voice. IX, X: Palate elevates symmetrically. Phonation is normal.  KP:Dynloizm shrug 5/5. XII: tongue is midline without fasciculations. Motor: 5/5 strength to all muscle groups tested.  Tone: is normal and bulk is normal Sensation- Intact to light touch bilaterally. Extinction absent to light touch to DSS.   Coordination: FTN intact bilaterally, HKS: no ataxia in BLE.No drift.  Gait- deferred  Most Recent NIH ***     ASSESSMENT/PLAN  [name] is a 43 y.o. female with history of *** admitted for ***.  NIH  on Admission ***  Acute Ischemic Infarct:  left MCA territory  Etiology:  ***  CT Head without contrast(Personally reviewed): Small acute or subacute cortical infarct in left anterior operculum aspects 8 CT angio Head and Neck with contrast(Personally reviewed): Left MCA distal M1 occlusion or near occlusion with 37 mm Tmax greater than 4 seconds parameter abnormality on CT perfusion MRI  *** 2D Echo *** LDL 111 HgbA1c No results found for requested labs within last 1095 days. VTE prophylaxis - lovenox No antithrombotic prior to admission, now on aspirin 81 mg daily and clopidogrel 75 mg daily for *** weeks and then *** alone. Therapy recommendations:  Pending Disposition:  ***  Hypertension Home meds:  norvasc  5mg  daily Uns***Stable {Blood Pressure Goal:29586}   Hyperlipidemia Home meds:  ***, ***resumed in hospital LDL 111, goal < 70 Add ***  High intensity statin not indicated *** Continue statin at discharge  Diabetes type II Unc***Controlled Home meds:  *** HgbA1c No results found for requested labs within last 1095 days., goal < 7.0 CBGs SSI Recommend close follow-up with PCP for better DM control  Tobacco Abuse Patient is a current smoker {Ready to Quit?:21018006} Nicotine replacement therapy provided  Substance Abuse Patient uses cocaine UDS positive for  THC and Cocaine {Ready to Quit?:21018006} TOC consult for cessation placed  Other Stroke Risk Factors ***ETOH use, alcohol level <15, advised to drink no more than 1***2 drink(s) a day ***Obesity, There is no height or weight on file to calculate BMI., BMI >/= 30 associated with increased stroke risk, recommend weight loss, diet and exercise as appropriate  ***Family hx stroke (***) ***Coronary artery disease ***Congestive heart failure ***Obstructive  sleep apnea, ***on CPAP at home ***Migraines   Other Active Problems   Hospital day # 1    To contact Stroke Continuity provider, please refer  to [email or web address]. After hours, contact General Neurology

## 2024-04-04 NOTE — Evaluation (Signed)
 Occupational Therapy Evaluation Patient Details Name: Sheri Mendoza MRN: [id number] DOB: 04-Aug-1980 Today's Date: 04/04/2024   History of Present Illness   Pt is a 43 y.o. female who presented 04/03/24 with aphasia. 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 PMH: HTN, polysubstance abuse     Clinical Impressions Patient admitted for above and presents with problem list below. She is answering yes/no questions, but unclear accuracy- reporting living with adult children and working part time but not driving.  She requires contact guard assist for bed mobility, min assist +2 for transfers and limited mobility, min to max assist for ADLs.  She has mild R sided weakness and decreased coordination, impaired balance, and decreased activity tolerance, as well as impaired communication and cognition.  Based on performance today, believe pt will best benefit from continued OT services acutely and after dc at an inpatient setting with >3hrs/day to optimize independence, safety and return to PLOF with ADLs and mobility.     If plan is discharge home, recommend the following:   A little help with walking and/or transfers;A lot of help with bathing/dressing/bathroom;Assistance with cooking/housework;Direct supervision/assist for medications management;Direct supervision/assist for financial management;Assist for transportation;Supervision due to cognitive status     Functional Status Assessment   Patient has had a recent decline in their functional status and demonstrates the ability to make significant improvements in function in a reasonable and predictable amount of time.     Equipment Recommendations   Other (comment) (defer)     Recommendations for Other Services   Rehab consult;Speech consult     Precautions/Restrictions   Precautions Precautions: Fall Recall of Precautions/Restrictions: Impaired Restrictions Weight Bearing Restrictions Per Provider Order:  No     Mobility Bed Mobility Overal bed mobility: Needs Assistance Bed Mobility: Supine to Sit     Supine to sit: Contact guard, HOB elevated     General bed mobility comments: Pt needed cues to direct her towards EOB as pt initially assumed long sitting in bed, CGA for safety as pt began to flex at her trunk and lean excessively anteriorly when scooting to EOB    Transfers Overall transfer level: Needs assistance Equipment used: 1 person hand held assist, 2 person hand held assist Transfers: Sit to/from Stand Sit to Stand: Min assist, +2 safety/equipment           General transfer comment: MinA for balance, +2 for safety, standing 3x from EOB with 1-2 HHA.      Balance Overall balance assessment: Needs assistance Sitting-balance support: No upper extremity supported, Feet supported Sitting balance-Leahy Scale: Good Sitting balance - Comments: able to reach off COG with CGA   Standing balance support: Single extremity supported, During functional activity Standing balance-Leahy Scale: Poor Standing balance comment: reliant on external physical assistance                           ADL either performed or assessed with clinical judgement   ADL Overall ADL's : Needs assistance/impaired     Grooming: Minimal assistance;Sitting           Upper Body Dressing : Minimal assistance;Sitting   Lower Body Dressing: Minimal assistance;Sit to/from stand   Toilet Transfer: Minimal assistance;Ambulation;+2 for safety/equipment   Toileting- Clothing Manipulation and Hygiene: Maximal assistance;Sit to/from stand       Functional mobility during ADLs: Minimal assistance;+2 for safety/equipment       Vision   Vision Assessment?: No apparent visual  deficits Additional Comments: continue assessment as needed     Perception         Praxis         Pertinent Vitals/Pain Pain Assessment Pain Assessment: Faces Faces Pain Scale: No hurt Pain  Intervention(s): Monitored during session     Extremity/Trunk Assessment Upper Extremity Assessment Upper Extremity Assessment: RUE deficits/detail;Right hand dominant RUE Deficits / Details: mildy decreased strength and coordination compared to LUE but difficult to assess due to cognition/communication RUE Coordination: decreased fine motor;decreased gross motor   Lower Extremity Assessment Lower Extremity Assessment: Defer to PT evaluation RLE Deficits / Details: weaker distally than proximally with MMT scores of 4- hip flexion (4 on L), 3+ knee extension (4+ on L), 3 ankle dorsiflexion (4+ on L); reports hx of peripheral neuropathy impacting sensation in bil feet, difficult to formally assess sensation this date but seemed accurate to light touch superior to ankle; difficult to assess coordination due to difficulty with multi-step/complex commands RLE Sensation: history of peripheral neuropathy   Cervical / Trunk Assessment Cervical / Trunk Assessment: Normal   Communication Communication Communication: Impaired Factors Affecting Communication: Difficulty expressing self;Reduced clarity of speech   Cognition Arousal: Alert, Lethargic Behavior During Therapy: WFL for tasks assessed/performed Cognition: Difficult to assess Difficult to assess due to: Impaired communication           OT - Cognition Comments: pt answering mostly yes/no questions, able to verbalize automatic responses                 Following commands: Impaired Following commands impaired: Only follows one step commands consistently, Follows multi-step commands inconsistently, Follows multi-step commands with increased time     Cueing  General Comments   Cueing Techniques: Verbal cues;Tactile cues;Gestural cues;Visual cues  VSS on RA   Exercises     Shoulder Instructions      Home Living Family/patient expects to be discharged to:: Private residence Living Arrangements: Children Available Help  at Discharge: Family Type of Home: Mobile home Home Access: Level entry     Home Layout: One level     Bathroom Shower/Tub: Producer, Television/film/video: Standard     Home Equipment: None   Additional Comments: pt primarily answering yes/no questions to provide info above and below, unsure of accuracy      Prior Functioning/Environment Prior Level of Function : Independent/Modified Independent;Working/employed               ADLs Comments: working part time?    OT Problem List: Decreased strength;Decreased activity tolerance;Impaired balance (sitting and/or standing);Decreased knowledge of precautions;Decreased knowledge of use of DME or AE;Decreased safety awareness;Decreased cognition;Decreased coordination   OT Treatment/Interventions: Self-care/ADL training;Therapeutic exercise;DME and/or AE instruction;Cognitive remediation/compensation;Therapeutic activities;Patient/family education;Balance training;Neuromuscular education      OT Goals(Current goals can be found in the care plan section)   Acute Rehab OT Goals Patient Stated Goal: none stated OT Goal Formulation: Patient unable to participate in goal setting Time For Goal Achievement: 04/18/24 Potential to Achieve Goals: Good   OT Frequency:  Min 2X/week    Co-evaluation PT/OT/SLP Co-Evaluation/Treatment: Yes Reason for Co-Treatment: For patient/therapist safety;To address functional/ADL transfers;Necessary to address cognition/behavior during functional activity;Other (comment) (pt lethargic) PT goals addressed during session: Mobility/safety with mobility;Balance OT goals addressed during session: ADL's and self-care      AM-PAC OT 6 Clicks Daily Activity     Outcome Measure Help from another person eating meals?: A Little Help from another person taking care of personal grooming?: A  Little Help from another person toileting, which includes using toliet, bedpan, or urinal?: A Lot Help from  another person bathing (including washing, rinsing, drying)?: A Little Help from another person to put on and taking off regular upper body clothing?: A Little Help from another person to put on and taking off regular lower body clothing?: A Little 6 Click Score: 17   End of Session Nurse Communication: Mobility status;Precautions  Activity Tolerance: Patient tolerated treatment well Patient left: in chair;with call bell/phone within reach;with chair alarm set  OT Visit Diagnosis: Other abnormalities of gait and mobility (R26.89);Muscle weakness (generalized) (M62.81);Other symptoms and signs involving cognitive function;Cognitive communication deficit (R41.841) Symptoms and signs involving cognitive functions: Cerebral infarction                Time: 8855-8788 OT Time Calculation (min): 27 min Charges:  OT General Charges $OT Visit: 1 Visit OT Evaluation $OT Eval Moderate Complexity: 1 Mod  Etta NOVAK, OT Acute Rehabilitation Services Office [phone] Secure Chat Preferred    Etta GORMAN [place] 04/04/2024, 1:34 PM

## 2024-04-04 NOTE — Progress Notes (Signed)
  Inpatient Rehab Admissions Coordinator :  Per therapy recommendations, patient was screened for CIR candidacy by Ottie Glazier RN MSN.  At this time patient appears to be a potential candidate for CIR. I will place a rehab consult per protocol for full assessment. Please call me with any questions.  Ottie Glazier RN MSN Admissions Coordinator [phone]

## 2024-04-04 NOTE — Evaluation (Signed)
 Physical Therapy Evaluation Patient Details Name: Sheri Mendoza MRN: [id number] DOB: 08-23-1980 Today's Date: 04/04/2024  History of Present Illness  Pt is a 42 y.o. female who presented 04/03/24 with aphasia. 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 PMH: HTN, polysubstance abuse  Clinical Impression  Pt presents with condition above and deficits mentioned below, see PT Problem List. The pt is currently displaying difficulty with verbally communicating, but appears to be answering yes/no questions accurately consistently. By answering yes/no questions, she was able to provide some PLOF/home info. Per pt PTA, she was independent, living with children in a mobile home with a level entry. Currently, the pt is needing CGA for bed mobility, minA for transfers, and minAx2 to ambulate with HHA. She is displaying not only deficits in expressive communication but also in balance, cognition, activity tolerance, and R-sided strength (worse distally). She is at risk for falls and has had a drastic functional decline and thereby could greatly benefit from intensive inpatient rehab, > 3 hours/day. Will continue to follow acutely.       If plan is discharge home, recommend the following: A lot of help with walking and/or transfers;A little help with bathing/dressing/bathroom;Assistance with cooking/housework;Direct supervision/assist for medications management;Direct supervision/assist for financial management;Assist for transportation;Help with stairs or ramp for entrance;Supervision due to cognitive status   Can travel by private vehicle        Equipment Recommendations Other (comment) (TBA further)  Recommendations for Other Services  Rehab consult    Functional Status Assessment Patient has had a recent decline in their functional status and demonstrates the ability to make significant improvements in function in a reasonable and predictable amount of time.     Precautions /  Restrictions Precautions Precautions: Fall Restrictions Weight Bearing Restrictions Per Provider Order: No      Mobility  Bed Mobility Overal bed mobility: Needs Assistance Bed Mobility: Supine to Sit     Supine to sit: Contact guard, HOB elevated     General bed mobility comments: Pt needed cues to direct her towards EOB as pt initially assumed long sitting in bed, CGA for safety as pt began to flex at her trunk and lean excessively anteriorly when scooting to EOB    Transfers Overall transfer level: Needs assistance Equipment used: 1 person hand held assist, 2 person hand held assist Transfers: Sit to/from Stand Sit to Stand: Min assist, +2 safety/equipment           General transfer comment: MinA for balance, +2 for safety, standing 3x from EOB with 1-2 HHA.    Ambulation/Gait Ambulation/Gait assistance: Min assist, +2 physical assistance, +2 safety/equipment Gait Distance (Feet): 80 Feet Assistive device: 1 person hand held assist Gait Pattern/deviations: Step-through pattern, Decreased step length - right, Decreased dorsiflexion - right, Decreased stride length, Knee flexed in stance - right Gait velocity: reduced Gait velocity interpretation: <1.8 ft/sec, indicate of risk for recurrent falls   General Gait Details: Pt requested HHA minA for balance. She needed +2 minA to provide verbal and tactile cues and assist her R knee in extending during stance phase intermittently. VCs also provided to clear R foot and take longer steps with her R.  Stairs            Wheelchair Mobility     Tilt Bed    Modified Rankin (Stroke Patients Only) Modified Rankin (Stroke Patients Only) Pre-Morbid Rankin Score: No symptoms Modified Rankin: Moderately severe disability     Balance Overall balance assessment: Needs  assistance Sitting-balance support: No upper extremity supported, Feet supported Sitting balance-Leahy Scale: Good Sitting balance - Comments: able to  reach off COG with CGA   Standing balance support: Single extremity supported, During functional activity Standing balance-Leahy Scale: Poor Standing balance comment: reliant on external physical assistance                             Pertinent Vitals/Pain Pain Assessment Pain Assessment: Faces Faces Pain Scale: No hurt Pain Intervention(s): Monitored during session    Home Living Family/patient expects to be discharged to:: Private residence Living Arrangements: Children Available Help at Discharge: Family Type of Home: Mobile home Home Access: Level entry       Home Layout: One level Home Equipment: None Additional Comments: pt primarily answering yes/no questions to provide info above and below, unsure of accuracy    Prior Function Prior Level of Function : Independent/Modified Independent;Working/employed               ADLs Comments: working part time?     Extremity/Trunk Assessment   Upper Extremity Assessment Upper Extremity Assessment: Defer to OT evaluation    Lower Extremity Assessment Lower Extremity Assessment: RLE deficits/detail RLE Deficits / Details: weaker distally than proximally with MMT scores of 4- hip flexion (4 on L), 3+ knee extension (4+ on L), 3 ankle dorsiflexion (4+ on L); reports hx of peripheral neuropathy impacting sensation in bil feet, difficult to formally assess sensation this date but seemed accurate to light touch superior to ankle; difficult to assess coordination due to difficulty with multi-step/complex commands RLE Sensation: history of peripheral neuropathy    Cervical / Trunk Assessment Cervical / Trunk Assessment: Normal  Communication   Communication Communication: Impaired Factors Affecting Communication: Difficulty expressing self;Reduced clarity of speech    Cognition Arousal: Alert, Lethargic Behavior During Therapy: WFL for tasks assessed/performed   PT - Cognitive impairments: Difficult to  assess, Awareness, Safety/Judgement, Attention, Sequencing, Problem solving Difficult to assess due to: Impaired communication                     PT - Cognition Comments: Pt answering yes/no questions accurately and minimally speaking, often just stating yeah to questions. Pt follows simple cues consistently but has difficulty with more complex or multi-step commands, needing step-by-step directions or guidance to complete. Decreased awareness of her R knee flexion when ambulating, placing her at risk for falls. Following commands: Impaired Following commands impaired: Only follows one step commands consistently, Follows multi-step commands inconsistently, Follows multi-step commands with increased time     Cueing Cueing Techniques: Verbal cues, Tactile cues, Gestural cues, Visual cues     General Comments General comments (skin integrity, edema, etc.): VSS on RA    Exercises     Assessment/Plan    PT Assessment Patient needs continued PT services  PT Problem List Decreased strength;Decreased activity tolerance;Decreased balance;Decreased mobility;Decreased cognition;Impaired sensation       PT Treatment Interventions DME instruction;Gait training;Stair training;Functional mobility training;Therapeutic activities;Therapeutic exercise;Balance training;Neuromuscular re-education;Cognitive remediation;Patient/family education    PT Goals (Current goals can be found in the Care Plan section)  Acute Rehab PT Goals Patient Stated Goal: to improve PT Goal Formulation: With patient Time For Goal Achievement: 04/18/24 Potential to Achieve Goals: Good    Frequency Min 3X/week     Co-evaluation   Reason for Co-Treatment: For patient/therapist safety;To address functional/ADL transfers;Necessary to address cognition/behavior during functional activity;Other (comment) (pt lethargic) PT goals  addressed during session: Mobility/safety with mobility;Balance OT goals addressed  during session: ADL's and self-care       AM-PAC PT 6 Clicks Mobility  Outcome Measure Help needed turning from your back to your side while in a flat bed without using bedrails?: A Little Help needed moving from lying on your back to sitting on the side of a flat bed without using bedrails?: A Little Help needed moving to and from a bed to a chair (including a wheelchair)?: A Little Help needed standing up from a chair using your arms (e.g., wheelchair or bedside chair)?: A Little Help needed to walk in hospital room?: Total Help needed climbing 3-5 steps with a railing? : Total 6 Click Score: 14    End of Session   Activity Tolerance: Patient tolerated treatment well Patient left: in chair;with call bell/phone within reach;with chair alarm set Nurse Communication: Mobility status PT Visit Diagnosis: Unsteadiness on feet (R26.81);Other abnormalities of gait and mobility (R26.89);Muscle weakness (generalized) (M62.81);Difficulty in walking, not elsewhere classified (R26.2);Other symptoms and signs involving the nervous system (R29.898);Hemiplegia and hemiparesis Hemiplegia - Right/Left: Right Hemiplegia - caused by: Cerebral infarction    Time: 8855-8789 PT Time Calculation (min) (ACUTE ONLY): 26 min   Charges:   PT Evaluation $PT Eval Moderate Complexity: 1 Mod   PT General Charges $$ ACUTE PT VISIT: 1 Visit         Theo Ferretti, PT, DPT Acute Rehabilitation Services  Office: [phone]   Theo CHRISTELLA Ferretti 04/04/2024, 12:44 PM

## 2024-04-05 DIAGNOSIS — F17210 Nicotine dependence, cigarettes, uncomplicated: Secondary | ICD-10-CM | POA: Diagnosis not present

## 2024-04-05 DIAGNOSIS — I7090 Unspecified atherosclerosis: Secondary | ICD-10-CM | POA: Diagnosis not present

## 2024-04-05 DIAGNOSIS — I110 Hypertensive heart disease with heart failure: Secondary | ICD-10-CM | POA: Diagnosis not present

## 2024-04-05 DIAGNOSIS — I69391 Dysphagia following cerebral infarction: Secondary | ICD-10-CM | POA: Diagnosis not present

## 2024-04-05 DIAGNOSIS — I63512 Cerebral infarction due to unspecified occlusion or stenosis of left middle cerebral artery: Secondary | ICD-10-CM | POA: Diagnosis not present

## 2024-04-05 DIAGNOSIS — I509 Heart failure, unspecified: Secondary | ICD-10-CM | POA: Diagnosis not present

## 2024-04-05 DIAGNOSIS — E785 Hyperlipidemia, unspecified: Secondary | ICD-10-CM | POA: Diagnosis not present

## 2024-04-05 DIAGNOSIS — R29706 NIHSS score 6: Secondary | ICD-10-CM | POA: Diagnosis not present

## 2024-04-05 LAB — CBC
HCT: 34.3 % — ABNORMAL LOW (ref 36.0–46.0)
Hemoglobin: 9.7 g/dL — ABNORMAL LOW (ref 12.0–15.0)
MCH: 19.7 pg — ABNORMAL LOW (ref 26.0–34.0)
MCHC: 28.3 g/dL — ABNORMAL LOW (ref 30.0–36.0)
MCV: 69.6 fL — ABNORMAL LOW (ref 80.0–100.0)
Platelets: 271 K/uL (ref 150–400)
RBC: 4.93 MIL/uL (ref 3.87–5.11)
RDW: 19.2 % — ABNORMAL HIGH (ref 11.5–15.5)
WBC: 7.6 K/uL (ref 4.0–10.5)
nRBC: 0 % (ref 0.0–0.2)

## 2024-04-05 LAB — BASIC METABOLIC PANEL WITH GFR
Anion gap: 8 (ref 5–15)
BUN: 8 mg/dL (ref 6–20)
CO2: 20 mmol/L — ABNORMAL LOW (ref 22–32)
Calcium: 9 mg/dL (ref 8.9–10.3)
Chloride: 110 mmol/L (ref 98–111)
Creatinine, Ser: 0.62 mg/dL (ref 0.44–1.00)
GFR, Estimated: >60 mL/min (ref >60)
Glucose, Bld: 104 mg/dL — ABNORMAL HIGH (ref 70–99)
Potassium: 4.1 mmol/L (ref 3.5–5.1)
Sodium: 138 mmol/L (ref 135–145)

## 2024-04-05 MED ORDER — NICOTINE 14 MG/24HR TD PT24
14.0000 mg | MEDICATED_PATCH | Freq: Every day | TRANSDERMAL | Status: DC
  Administered 2024-04-05 – 2024-04-06 (×2): 14 mg via TRANSDERMAL
  Filled 2024-04-05 (×2): qty 1

## 2024-04-05 MED ORDER — AMLODIPINE BESYLATE 5 MG PO TABS
5.0000 mg | ORAL_TABLET | Freq: Every day | ORAL | Status: DC
  Administered 2024-04-05 – 2024-04-06 (×2): 5 mg via ORAL
  Filled 2024-04-05 (×2): qty 1

## 2024-04-05 MED ORDER — LABETALOL HCL 5 MG/ML IV SOLN: 5.0000 mg | INTRAVENOUS | Status: DC | PRN

## 2024-04-05 NOTE — Progress Notes (Addendum)
 STROKE TEAM PROGRESS NOTE    SIGNIFICANT HOSPITAL EVENTS 12/2: Presented with severe expressive aphasia  CTH: acute to subacute infarct in left anterior operculum CTA: and severe left MCA M1 stenosis  Transferred to [facility] for close monitoring and low threshold to call interventional radiology to reevaluate for intervention if symptoms worsen.   INTERIM HISTORY/SUBJECTIVE No family at the bedside.  Patient is sitting in the chair in no apparent distress.  Still with expressive aphasia, can follow commands subtle right facial droop Some mild left over right orbiting Neurological exam remains stable and unchanged will transfer patient out of the ICU today  CBC    Component Value Date/Time   WBC 7.6 04/05/2024 0409   RBC 4.93 04/05/2024 0409   HGB 9.7 (L) 04/05/2024 0409   HCT 34.3 (L) 04/05/2024 0409   PLT 271 04/05/2024 0409   MCV 69.6 (L) 04/05/2024 0409   MCH 19.7 (L) 04/05/2024 0409   MCHC 28.3 (L) 04/05/2024 0409   RDW 19.2 (H) 04/05/2024 0409   LYMPHSABS 2.4 04/03/2024 1136   MONOABS 0.6 04/03/2024 1136   EOSABS 0.5 04/03/2024 1136   BASOSABS 0.1 04/03/2024 1136    BMET    Component Value Date/Time   NA 138 04/05/2024 0409   K 4.1 04/05/2024 0409   CL 110 04/05/2024 0409   CO2 20 (L) 04/05/2024 0409   GLUCOSE 104 (H) 04/05/2024 0409   BUN 8 04/05/2024 0409   CREATININE 0.62 04/05/2024 0409   CALCIUM 9.0 04/05/2024 0409   GFRNONAA >60 04/05/2024 0409    IMAGING past 24 hours No results found.   Vitals:   04/05/24 0900 04/05/24 1000 04/05/24 1100 04/05/24 1145  BP: (!) 183/101 (!) 179/92 (!) 188/107   Pulse: 71 75 62   Resp: 17 18 19    Temp:    97.7 F (36.5 C)  TempSrc:    Oral  SpO2: 100% 99% 98%      PHYSICAL EXAM General:  Alert, well-nourished, well-developed patient in no acute distress Psych:  Mood and affect appropriate for situation CV: Regular rate and rhythm on monitor Respiratory:  Regular, unlabored respirations on room air GI: Abdomen soft  and nontender   NEURO:  Mental Status: AA&Ox self and hospital. Expressive aphasia, mild dysarthria able to follow commands  Cranial Nerves:  II: PERRL. Visual fields full.  III, IV, VI: EOMI. Eyelids elevate symmetrically.  V: Sensation is intact to light touch and symmetrical to face.  VII: right facial droop VIII: hearing intact to voice. IX, X: Palate elevates symmetrically. Phonation is normal.  KP:Dynloizm shrug 5/5. XII: tongue is midline without fasciculations. Motor: 5/5 strength bilateral uppers, and left lower, right lower with drift  Tone: is normal and bulk is normal Sensation- Intact to light touch bilaterally. Extinction absent to light touch to DSS.   Coordination: FTN intact bilaterally, HKS: no ataxia in BLE.No drift.  Mild left over right orbiting Gait- deferred  Most Recent NIH   1a Level of Conscious.:  1b LOC Questions: 1 1c LOC Commands:  2 Best Gaze:  3 Visual:  4 Facial Palsy: 1 5a Motor Arm - left:  5b Motor Arm - Right:  6a Motor Leg - Left:  6b Motor Leg - Right: 1 7 Limb Ataxia:  8 Sensory:  9 Best Language: 2 10 Dysarthria: 1 11 Extinct. and Inatten.:  TOTAL: 6   ASSESSMENT/PLAN  [name] is a 43 y.o. female with history of  hypertension and polysubstance abuse who presents  with severe expressive aphasia which began yesterday evening. NIH on Admission 5  Acute Ischemic Infarct:  left MCA, patchy   Etiology:  high grade intracranial stenosis   Code Stroke CT head Small acute or subacute cortical infarct in the left anterior operculum (2cm), and more subtle age-indeterminate hypodensity near the genu of the left internal capsule. ASPECTS 8. CTA head & neck Left MCA distal M1 occlusion/near occlusion.  Mild to moderate upstream Right carotid (primarily RICA siphon) atherosclerosis. And also calcified plaque in the Right vertebral V3 segment. CT perfusion 37 mL of Tmax greater than 4 s volume is detected in the left MCA  territory. MRI: 1. Patchy acute infarcts throughout the left MCA territory. 2. Chronic lacunar infarcts in the left internal capsule, left thalamus, and cerebellum. 2D Echo EF 65-70% severe concentric  LVH with Grade I diastolic dysfunction  LDL 111 YhaJ8r 5.2 UDS positive for cocaine and THC  VTE prophylaxis - lovenox  No antithrombotic prior to admission, now on aspirin 81 mg daily and clopidogrel 75 mg daily for 3 months and then ASA alone. Therapy recommendations:  Pending Disposition:  pending   Hypertension Home meds:  amlodipine  5 mg  Stable Blood Pressure Goal: BP less than 220/110   Hyperlipidemia Home meds:  none LDL 111, goal < 70 Add atorvastatin 80mg    Continue statin at discharge  Tobacco Abuse Patient smokes 1/2 packs per day f       Ready to quit? Yes Nicotine replacement therapy provided  Substance Abuse Patient uses THC and cocaine UDS positive for  THC  Cocaine       Ready to quit? Yes TOC consult for cessation placed  Dysphagia Patient has post-stroke dysphagia, SLP consulted    Diet   Diet Heart Room service appropriate? Yes with Assist; Fluid consistency: Thin   Advance diet as tolerated  Hypokalemia  K 3.2 replaced  Recheck in am   Other Stroke Risk Factors ETOH use, alcohol level <15, advised to drink no more than 1 drink(s) a day Congestive heart failure  Hospital day # 2   Karna Geralds DNP, ACNPC-AG  [facility]  I have personally obtained history,examined this patient, reviewed notes, independently viewed imaging studies, participated in medical decision making and plan of care.ROS completed by me personally and pertinent positives fully documented  I have made any additions or clarifications directly to the above note. Agree with note above.  Patient with expressive aphasia but is improving.  Mobilize out of bed.  Therapy consults.  Transfer out of ICU to floor bed today.  Discussed with patient's son over the phone and  answered questions about her condition.  Continue dual antiplatelet therapy and aggressive risk factor modification.     I personally spent a total of 50 minutes in the care of the patient today including getting/reviewing separately obtained history, performing a medically appropriate exam/evaluation, counseling and educating, placing orders, referring and communicating with other health care professionals, documenting clinical information in the EHR, independently interpreting results, and coordinating care.        Eather Popp, MD Medical Director [facility] Pager: [phone] 04/05/2024 12:54 PM   To contact Stroke Continuity provider, please refer to [email or web address]. After hours, contact General Neurology

## 2024-04-05 NOTE — Plan of Care (Signed)
  Problem: Clinical Measurements: Goal: Respiratory complications will improve Outcome: Progressing   Problem: Activity: Goal: Risk for activity intolerance will decrease Outcome: Progressing   Problem: Nutrition: Goal: Adequate nutrition will be maintained Outcome: Progressing   Problem: Coping: Goal: Level of anxiety will decrease Outcome: Progressing   Problem: Elimination: Goal: Will not experience complications related to urinary retention Outcome: Progressing   Problem: Pain Managment: Goal: General experience of comfort will improve and/or be controlled Outcome: Progressing   Problem: Safety: Goal: Ability to remain free from injury will improve Outcome: Progressing   Problem: Skin Integrity: Goal: Risk for impaired skin integrity will decrease Outcome: Progressing   Problem: Ischemic Stroke/TIA Tissue Perfusion: Goal: Complications of ischemic stroke/TIA will be minimized Outcome: Progressing   Problem: Self-Care: Goal: Ability to participate in self-care as condition permits will improve Outcome: Progressing   Problem: Nutrition: Goal: Risk of aspiration will decrease Outcome: Progressing Goal: Dietary intake will improve Outcome: Progressing   Problem: Self-Care: Goal: Ability to communicate needs accurately will improve Outcome: Not Progressing

## 2024-04-05 NOTE — Plan of Care (Signed)
 Assisted to bathroom. Able to verbalize needs.    Problem: Education: Goal: Knowledge of General Education information will improve Description: Including pain rating scale, medication(s)/side effects and non-pharmacologic comfort measures Outcome: Progressing   Problem: Activity: Goal: Risk for activity intolerance will decrease Outcome: Progressing   Problem: Nutrition: Goal: Adequate nutrition will be maintained Outcome: Progressing   Problem: Safety: Goal: Ability to remain free from injury will improve Outcome: Progressing   Problem: Skin Integrity: Goal: Risk for impaired skin integrity will decrease Outcome: Progressing

## 2024-04-05 NOTE — Progress Notes (Signed)
 Physical Therapy Treatment Patient Details Name: Sheri Mendoza MRN: [id number] DOB: Mar 09, 1981 Today's Date: 04/05/2024   History of Present Illness Pt is a 43 y.o. female who presented 04/03/24 with aphasia. 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 PMH: HTN, polysubstance abuse    PT Comments  Patient responding to questions with delay and some incorrect responses to DOB, however overall following simple verbal commands ~90% of time. Noted to be independent with bed mobility and CGA with transfers and gait (for safety). Performed most items of Dynamic Gait Index on command and with very little impairment. Able to demonstrate use of call bell and appeared to understand not to get up alone (although still on chair alarm for safety).     If plan is discharge home, recommend the following: Assistance with cooking/housework;Direct supervision/assist for medications management;Direct supervision/assist for financial management;Assist for transportation;Help with stairs or ramp for entrance;Supervision due to cognitive status   Can travel by private vehicle        Equipment Recommendations  None recommended by PT    Recommendations for Other Services       Precautions / Restrictions Precautions Precautions: Fall;Other (comment) Recall of Precautions/Restrictions: Impaired Precaution/Restrictions Comments: BP <220/110 Restrictions Weight Bearing Restrictions Per Provider Order: No     Mobility  Bed Mobility Overal bed mobility: Independent Bed Mobility: Supine to Sit     Supine to sit: Independent          Transfers Overall transfer level: Needs assistance Equipment used: None Transfers: Sit to/from Stand Sit to Stand: Contact guard assist           General transfer comment: no assist needed; CGA for safety and lines    Ambulation/Gait Ambulation/Gait assistance: Contact guard assist Gait Distance (Feet): 200 Feet Assistive device:  None Gait Pattern/deviations: Step-through pattern, Decreased stride length Gait velocity: able to vary speed up/down on request Gait velocity interpretation: >2.62 ft/sec, indicative of community ambulatory   General Gait Details: see DGI; able to follow most instructions   Stairs             Wheelchair Mobility     Tilt Bed    Modified Rankin (Stroke Patients Only) Modified Rankin (Stroke Patients Only) Pre-Morbid Rankin Score: No symptoms Modified Rankin: Moderately severe disability     Balance Overall balance assessment: Needs assistance Sitting-balance support: No upper extremity supported, Feet supported Sitting balance-Leahy Scale: Normal     Standing balance support: During functional activity, No upper extremity supported Standing balance-Leahy Scale: Good                   Standardized Balance Assessment Standardized Balance Assessment : Dynamic Gait Index   Dynamic Gait Index Level Surface: Mild Impairment Change in Gait Speed: Normal Gait with Horizontal Head Turns: Normal Gait with Vertical Head Turns: Normal Gait and Pivot Turn: Normal      Communication Communication Communication: Impaired Factors Affecting Communication: Difficulty expressing self  Cognition Arousal: Alert Behavior During Therapy: WFL for tasks assessed/performed   PT - Cognitive impairments: Difficult to assess Difficult to assess due to: Impaired communication                     PT - Cognition Comments: With time for responses, states name, DOB ( with prompts), location, situation. She can demonstrate use of call bell when asked Following commands: Impaired Following commands impaired: Follows one step commands inconsistently, Follows one step commands with increased time  Cueing Cueing Techniques: Verbal cues, Tactile cues, Gestural cues  Exercises      General Comments General comments (skin integrity, edema, etc.): BP pre-181/101; after  ambulation 190/118; after rested 2 minutes 183/108      Pertinent Vitals/Pain Pain Assessment Pain Assessment: No/denies pain Faces Pain Scale: No hurt    Home Living                          Prior Function            PT Goals (current goals can now be found in the care plan section) Acute Rehab PT Goals Patient Stated Goal: to improve Time For Goal Achievement: 04/18/24 Potential to Achieve Goals: Good Progress towards PT goals: Progressing toward goals    Frequency    Min 3X/week      PT Plan      Co-evaluation              AM-PAC PT 6 Clicks Mobility   Outcome Measure  Help needed turning from your back to your side while in a flat bed without using bedrails?: A Little Help needed moving from lying on your back to sitting on the side of a flat bed without using bedrails?: A Little Help needed moving to and from a bed to a chair (including a wheelchair)?: A Little Help needed standing up from a chair using your arms (e.g., wheelchair or bedside chair)?: A Little Help needed to walk in hospital room?: A Little Help needed climbing 3-5 steps with a railing? : A Little 6 Click Score: 18    End of Session Equipment Utilized During Treatment: Gait belt Activity Tolerance: Patient tolerated treatment well Patient left: in chair;with call bell/phone within reach;with chair alarm set Nurse Communication: Mobility status;Other (comment) (BP initially elevated) PT Visit Diagnosis: Unsteadiness on feet (R26.81);Other abnormalities of gait and mobility (R26.89);Muscle weakness (generalized) (M62.81);Difficulty in walking, not elsewhere classified (R26.2);Other symptoms and signs involving the nervous system (R29.898);Hemiplegia and hemiparesis Hemiplegia - Right/Left: Right Hemiplegia - caused by: Cerebral infarction     Time: 0902-0919 PT Time Calculation (min) (ACUTE ONLY): 17 min  Charges:    $Gait Training: 8-22 mins PT General Charges $$  ACUTE PT VISIT: 1 Visit                      Macario RAMAN, PT Acute Rehabilitation Services  Office [phone]    Macario SHAUNNA Soja 04/05/2024, 9:33 AM

## 2024-04-05 NOTE — Progress Notes (Signed)
 IP rehab admissions - I met with patient at the bedside.  Noted OT now recommending outpatient therapy.  I messaged PT and they are changing their recommendations as well.  Patient is doing well walking 200' with CGA today.  Recommend discharge home with [facility] or outpatient therapy once medically ready for discharge.  [phone]

## 2024-04-06 DIAGNOSIS — F17200 Nicotine dependence, unspecified, uncomplicated: Secondary | ICD-10-CM | POA: Diagnosis present

## 2024-04-06 DIAGNOSIS — I10 Essential (primary) hypertension: Secondary | ICD-10-CM | POA: Diagnosis present

## 2024-04-06 DIAGNOSIS — E785 Hyperlipidemia, unspecified: Secondary | ICD-10-CM | POA: Diagnosis present

## 2024-04-06 DIAGNOSIS — I63512 Cerebral infarction due to unspecified occlusion or stenosis of left middle cerebral artery: Secondary | ICD-10-CM | POA: Diagnosis not present

## 2024-04-06 DIAGNOSIS — F1910 Other psychoactive substance abuse, uncomplicated: Secondary | ICD-10-CM | POA: Diagnosis present

## 2024-04-06 DIAGNOSIS — R29701 NIHSS score 1: Secondary | ICD-10-CM | POA: Diagnosis not present

## 2024-04-06 MED ORDER — CLOPIDOGREL BISULFATE 75 MG PO TABS: 75.0000 mg | ORAL_TABLET | Freq: Every day | ORAL | 0 refills | Status: AC

## 2024-04-06 MED ORDER — AMLODIPINE BESYLATE 5 MG PO TABS: 5.0000 mg | ORAL_TABLET | Freq: Every day | ORAL | 0 refills | Status: AC

## 2024-04-06 MED ORDER — NICOTINE 14 MG/24HR TD PT24: 14.0000 mg | MEDICATED_PATCH | Freq: Every day | TRANSDERMAL | 0 refills | Status: AC

## 2024-04-06 MED ORDER — ATORVASTATIN CALCIUM 80 MG PO TABS: 80.0000 mg | ORAL_TABLET | Freq: Every day | ORAL | 0 refills | Status: AC

## 2024-04-06 MED ORDER — ASPIRIN 81 MG PO CHEW: 81.0000 mg | CHEWABLE_TABLET | Freq: Every day | ORAL | 0 refills | Status: AC

## 2024-04-06 NOTE — Progress Notes (Signed)
 Patient has been provided discharge instructions to include outpatient therapy and medications. She verbalizes understanding of all instructions.

## 2024-04-06 NOTE — Plan of Care (Signed)

## 2024-04-06 NOTE — TOC Transition Note (Signed)
 Transition of Care [facility]) - Discharge Note   Patient Details  Name: Sheri Mendoza MRN: [id number] Date of Birth: 1980/10/16  Transition of Care [facility]) CM/SW Contact:  Corean JAYSON Canary, RN Phone Number: 04/06/2024, 11:51 AM   Clinical Narrative:     Patient will be discharging today. PT initially recommended CIR, but now OP PT OT.   Orders placed for Neuro rehab, on AVS.   No further needs identified.   Final next level of care: Home/Self Care Barriers to Discharge: No Barriers Identified   Patient Goals and CMS Choice            Discharge Placement                       Discharge Plan and Services Additional resources added to the After Visit Summary for                                       Social Drivers of Health (SDOH) Interventions SDOH Screenings   Tobacco Use: High Risk (03/22/2022)     Readmission Risk Interventions     No data to display

## 2024-04-06 NOTE — Progress Notes (Signed)
 Occupational Therapy Treatment Patient Details Name: Sheri Mendoza MRN: [id number] DOB: 04/19/1981 Today's Date: 04/06/2024   History of present illness Pt is a 43 y.o. female who presented 04/03/24 with aphasia. 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 PMH: HTN, polysubstance abuse   OT comments  Patient demonstrating good gains with OT treatment. Patient is independent with bed mobility and was supervision to [facility] for mobility and self care. Patient verbalizing more but continues to have difficulty with word finding.  Discharge recommendations changed from AIR to OPOT due to patient's progress and family support at home.  Acute OT to continue to follow to address established goals.       If plan is discharge home, recommend the following:  A little help with walking and/or transfers;Assistance with cooking/housework;Direct supervision/assist for medications management;Direct supervision/assist for financial management;Assist for transportation;Supervision due to cognitive status;A little help with bathing/dressing/bathroom   Equipment Recommendations  Other (comment) (defer)    Recommendations for Other Services      Precautions / Restrictions Precautions Precautions: Fall;Other (comment) Recall of Precautions/Restrictions: Impaired Precaution/Restrictions Comments: BP <220/110 Restrictions Weight Bearing Restrictions Per Provider Order: No       Mobility Bed Mobility Overal bed mobility: Independent Bed Mobility: Supine to Sit, Sit to Supine     Supine to sit: Independent Sit to supine: Independent   General bed mobility comments: able to perform without assistance    Transfers Overall transfer level: Needs assistance Equipment used: None Transfers: Sit to/from Stand Sit to Stand: Contact guard assist           General transfer comment: CGA for safety with no LOB     Balance Overall balance assessment: Needs assistance Sitting-balance  support: No upper extremity supported, Feet supported Sitting balance-Leahy Scale: Normal     Standing balance support: During functional activity, No upper extremity supported Standing balance-Leahy Scale: Good Standing balance comment: no LOB while standing at sink for during mobility                           ADL either performed or assessed with clinical judgement   ADL Overall ADL's : Needs assistance/impaired     Grooming: Wash/dry hands;Wash/dry face;Oral care;Applying deodorant;Brushing hair;Contact guard assist;Standing Grooming Details (indicate cue type and reason): at sink             Lower Body Dressing: Contact guard assist;Sit to/from stand Lower Body Dressing Details (indicate cue type and reason): donned pants and socks             Functional mobility during ADLs: Contact guard assist General ADL Comments: CGA for safety with functional mobility    Extremity/Trunk Assessment              Vision       Perception     Praxis     Communication Communication Communication: Impaired Factors Affecting Communication: Difficulty expressing self   Cognition Arousal: Alert Behavior During Therapy: WFL for tasks assessed/performed Cognition: Cognition impaired, Difficult to assess Difficult to assess due to: Impaired communication Orientation impairments: Time, Place     Attention impairment (select first level of impairment): Alternating attention Executive functioning impairment (select all impairments): Sequencing OT - Cognition Comments: verbalizing more and answering questions, word finding difficulties                 Following commands: Impaired Following commands impaired: Follows multi-step commands inconsistently, Follows multi-step commands with increased time  Cueing   Cueing Techniques: Verbal cues, Tactile cues, Gestural cues  Exercises      Shoulder Instructions       General Comments performed  pathfinding tasks with patient able to locate room without cues    Pertinent Vitals/ Pain       Pain Assessment Pain Assessment: No/denies pain Pain Intervention(s): Monitored during session  Home Living                                          Prior Functioning/Environment              Frequency  Min 2X/week        Progress Toward Goals  OT Goals(current goals can now be found in the care plan section)  Progress towards OT goals: Progressing toward goals  Acute Rehab OT Goals Patient Stated Goal: to go home OT Goal Formulation: With patient Time For Goal Achievement: 04/18/24 Potential to Achieve Goals: Good ADL Goals Pt Will Perform Grooming: standing;with supervision Pt Will Perform Lower Body Dressing: with supervision;sit to/from stand Pt Will Transfer to Toilet: with supervision;ambulating Pt Will Perform Toileting - Clothing Manipulation and hygiene: with supervision;sit to/from stand Additional ADL Goal #1: Pt will demonstrate ability to follow 3 step ADL task with supervision. Additional ADL Goal #2: Pt will complete 3 step way finding task with supervision, using compensatory techniques as needed.  Plan      Co-evaluation                 AM-PAC OT 6 Clicks Daily Activity     Outcome Measure   Help from another person eating meals?: A Little Help from another person taking care of personal grooming?: A Little Help from another person toileting, which includes using toliet, bedpan, or urinal?: A Little Help from another person bathing (including washing, rinsing, drying)?: A Little Help from another person to put on and taking off regular upper body clothing?: A Little Help from another person to put on and taking off regular lower body clothing?: A Little 6 Click Score: 18    End of Session Equipment Utilized During Treatment: Gait belt  OT Visit Diagnosis: Other abnormalities of gait and mobility (R26.89);Muscle weakness  (generalized) (M62.81);Other symptoms and signs involving cognitive function;Cognitive communication deficit (R41.841) Symptoms and signs involving cognitive functions: Cerebral infarction   Activity Tolerance Patient tolerated treatment well   Patient Left in bed;with call bell/phone within reach   Nurse Communication Mobility status;Precautions        Time: 9260-9199 OT Time Calculation (min): 21 min  Charges: OT General Charges $OT Visit: 1 Visit OT Treatments $Self Care/Home Management : 8-22 mins  Dick Mendoza, OTA Acute Rehabilitation Services  Office [phone]   Sheri Mendoza 04/06/2024, 12:50 PM

## 2024-04-06 NOTE — Progress Notes (Signed)
 Physical Therapy Treatment Patient Details Name: Sheri Mendoza MRN: [id number] DOB: [birth year] Today's Date: 04/06/2024   History of Present Illness Pt is a 43 y.o. female who presented 04/03/24 with aphasia. 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 PMH: HTN, polysubstance abuse   PT Comments  Pt received in supine and agreeable to PT session. Pt continues to have difficulty word finding with increased time to respond to questions. Pt overall supervision to CGA for all mobility with no AD. Focused session on LE strengthening and dynamic balance activities (see below). Went over HEP to address functional deficits with no further questions. Pt feels comfortable discharging home when medically stable. Continue to recommend OP PT with acute PT to follow.     If plan is discharge home, recommend the following: Assistance with cooking/housework;Direct supervision/assist for medications management;Direct supervision/assist for financial management;Assist for transportation;Help with stairs or ramp for entrance;Supervision due to cognitive status   Can travel by private vehicle      Yes  Equipment Recommendations  None recommended by PT       Precautions / Restrictions Precautions Precautions: Fall;Other (comment) Recall of Precautions/Restrictions: Impaired Restrictions Weight Bearing Restrictions Per Provider Order: No     Mobility  Bed Mobility Overal bed mobility: Independent Bed Mobility: Supine to Sit, Sit to Supine    Supine to sit: Independent Sit to supine: Independent      Transfers Overall transfer level: Needs assistance Equipment used: None Transfers: Sit to/from Stand Sit to Stand: Supervision    General transfer comment: supervision for safety    Ambulation/Gait Ambulation/Gait assistance: Contact guard assist Gait Distance (Feet): 400 Feet Assistive device: None Gait Pattern/deviations: Step-through pattern, Decreased stride length      General Gait Details: steady with challenges to balance     Modified Rankin (Stroke Patients Only) Modified Rankin (Stroke Patients Only) Pre-Morbid Rankin Score: No symptoms Modified Rankin: Moderately severe disability     Balance Overall balance assessment: Needs assistance Sitting-balance support: No upper extremity supported, Feet supported Sitting balance-Leahy Scale: Normal    Standing balance support: During functional activity, No upper extremity supported Standing balance-Leahy Scale: Good    High level balance activites: Backward walking, Side stepping High Level Balance Comments: 2x31ft backwards walking, 2x10 sideways steps         Communication Communication Communication: Impaired Factors Affecting Communication: Difficulty expressing self  Cognition Arousal: Alert Behavior During Therapy: WFL for tasks assessed/performed   PT - Cognitive impairments: Safety/Judgement, Problem solving    Following commands: Impaired Following commands impaired: Follows multi-step commands inconsistently, Follows multi-step commands with increased time    Cueing Cueing Techniques: Verbal cues, Tactile cues, Gestural cues  Exercises Other Exercises Other Exercises: Tandem walking 2x10 ft forwards/backwards Other Exercises: Bx10 step-ups with one UE support Other Exercises: Bx10 sideways step-ups with one UE support Other Exercises: x10 STS with no UE support    General Comments General comments (skin integrity, edema, etc.): HEP- [id number]      Pertinent Vitals/Pain Pain Assessment Pain Assessment: No/denies pain     PT Goals (current goals can now be found in the care plan section) Acute Rehab PT Goals PT Goal Formulation: With patient Time For Goal Achievement: 04/18/24 Potential to Achieve Goals: Good Progress towards PT goals: Progressing toward goals    Frequency    Min 3X/week       AM-PAC PT 6 Clicks Mobility   Outcome Measure  Help needed  turning from your back to your side while  in a flat bed without using bedrails?: None Help needed moving from lying on your back to sitting on the side of a flat bed without using bedrails?: None Help needed moving to and from a bed to a chair (including a wheelchair)?: A Little Help needed standing up from a chair using your arms (e.g., wheelchair or bedside chair)?: A Little Help needed to walk in hospital room?: A Little Help needed climbing 3-5 steps with a railing? : A Little 6 Click Score: 20    End of Session Equipment Utilized During Treatment: Gait belt Activity Tolerance: Patient tolerated treatment well Patient left: in bed;with call bell/phone within reach;with bed alarm set Nurse Communication: Mobility status PT Visit Diagnosis: Unsteadiness on feet (R26.81);Other abnormalities of gait and mobility (R26.89);Muscle weakness (generalized) (M62.81);Difficulty in walking, not elsewhere classified (R26.2);Other symptoms and signs involving the nervous system (R29.898);Hemiplegia and hemiparesis Hemiplegia - Right/Left: Right Hemiplegia - caused by: Cerebral infarction     Time: 8892-8866 PT Time Calculation (min) (ACUTE ONLY): 26 min  Charges:    $Gait Training: 8-22 mins $Neuromuscular Re-education: 8-22 mins PT General Charges $$ ACUTE PT VISIT: 1 Visit                    Sheri ORN, PT, DPT Secure Chat Preferred  Rehab Office [phone]   Sheri Mendoza 04/06/2024, 1:27 PM

## 2024-04-06 NOTE — Discharge Summary (Addendum)
 Stroke Discharge Summary  Patient ID: Sheri Mendoza   MRN: [id number]      DOB: 06/20/1980  Date of Admission: 04/03/2024 Date of Discharge: 04/06/2024  Attending Physician:  Stroke, Md, MD Patient's PCP:  Pcp, No  DISCHARGE DIAGNOSIS: Acute Ischemic Infarct:  left MCA, patchy   Etiology:  high grade intracranial left MCA stenosis Principal Problem:   Acute ischemic left MCA stroke (HCC) Expressive aphasia Intracranial left MCA stenosis Active Problems:   Tobacco use disorder   Substance abuse (HCC)   HTN (hypertension)   HLD (hyperlipidemia)   Allergies as of 04/06/2024   No Known Allergies      Medication List     STOP taking these medications    Aspirin -Caffeine 845-65 MG Pack   ibuprofen 200 MG tablet Commonly known as: ADVIL       TAKE these medications    amLODipine  5 MG tablet Commonly known as: NORVASC  Take 1 tablet (5 mg total) by mouth daily. Start taking on: April 07, 2024   aspirin  81 MG chewable tablet Chew 1 tablet (81 mg total) by mouth daily. Start taking on: April 07, 2024   atorvastatin  80 MG tablet Commonly known as: LIPITOR  Take 1 tablet (80 mg total) by mouth daily. Start taking on: April 07, 2024   clopidogrel  75 MG tablet Commonly known as: PLAVIX  Take 1 tablet (75 mg total) by mouth daily. Start taking on: April 07, 2024   nicotine  14 mg/24hr patch Commonly known as: NICODERM CQ  - dosed in mg/24 hours Place 1 patch (14 mg total) onto the skin daily. Start taking on: April 07, 2024        LABORATORY STUDIES CBC    Component Value Date/Time   WBC 7.6 04/05/2024 0409   RBC 4.93 04/05/2024 0409   HGB 9.7 (L) 04/05/2024 0409   HCT 34.3 (L) 04/05/2024 0409   PLT 271 04/05/2024 0409   MCV 69.6 (L) 04/05/2024 0409   MCH 19.7 (L) 04/05/2024 0409   MCHC 28.3 (L) 04/05/2024 0409   RDW 19.2 (H) 04/05/2024 0409   LYMPHSABS 2.4 04/03/2024 1136   MONOABS 0.6 04/03/2024 1136   EOSABS 0.5 04/03/2024 1136    BASOSABS 0.1 04/03/2024 1136   CMP    Component Value Date/Time   NA 138 04/05/2024 0409   K 4.1 04/05/2024 0409   CL 110 04/05/2024 0409   CO2 20 (L) 04/05/2024 0409   GLUCOSE 104 (H) 04/05/2024 0409   BUN 8 04/05/2024 0409   CREATININE 0.62 04/05/2024 0409   CALCIUM  9.0 04/05/2024 0409   PROT 6.2 (L) 04/04/2024 0253   ALBUMIN 3.3 (L) 04/04/2024 0253   AST 15 04/04/2024 0253   ALT 11 04/04/2024 0253   ALKPHOS 55 04/04/2024 0253   BILITOT 0.8 04/04/2024 0253   GFRNONAA >60 04/05/2024 0409   COAGS Lab Results  Component Value Date   INR 0.9 04/03/2024   Lipid Panel    Component Value Date/Time   CHOL 153 04/04/2024 0253   TRIG 87 04/04/2024 0253   HDL 25 (L) 04/04/2024 0253   CHOLHDL 6.1 04/04/2024 0253   VLDL 17 04/04/2024 0253   LDLCALC 111 (H) 04/04/2024 0253   HgbA1C  Lab Results  Component Value Date   HGBA1C 5.2 04/04/2024   Urinalysis    Component Value Date/Time   COLORURINE YELLOW (A) 12/15/2021 1622   APPEARANCEUR HAZY (A) 12/15/2021 1622   LABSPEC 1.025 12/15/2021 1622   PHURINE 6.0 12/15/2021 1622  GLUCOSEU NEGATIVE 12/15/2021 1622   HGBUR NEGATIVE 12/15/2021 1622   BILIRUBINUR NEGATIVE 12/15/2021 1622   KETONESUR NEGATIVE 12/15/2021 1622   PROTEINUR 30 (A) 12/15/2021 1622   NITRITE NEGATIVE 12/15/2021 1622   LEUKOCYTESUR TRACE (A) 12/15/2021 1622   Urine Drug Screen     Component Value Date/Time   LABOPIA NONE DETECTED 04/03/2024 2055   COCAINSCRNUR POSITIVE (A) 04/03/2024 2055   LABBENZ NONE DETECTED 04/03/2024 2055   AMPHETMU NONE DETECTED 04/03/2024 2055   THCU POSITIVE (A) 04/03/2024 2055   LABBARB NONE DETECTED 04/03/2024 2055    Alcohol Level    Component Value Date/Time   ETH <15 04/03/2024 1136     SIGNIFICANT DIAGNOSTIC STUDIES ECHOCARDIOGRAM COMPLETE Result Date: 04/04/2024    ECHOCARDIOGRAM REPORT   Patient Name:   Sheri Mendoza Date of Exam: 04/04/2024 Medical Rec #:  [id number]    Height:       63.0 in Accession #:     [id number]   Weight:       179.9 lb Date of Birth:  09-26-1980    BSA:          1.849 m Patient Age:    43 years     BP:           175/84 mmHg Patient Gender: F            HR:           74 bpm. Exam Location:  Inpatient Procedure: 2D Echo, Cardiac Doppler, Color Doppler and Strain Analysis (Both            Spectral and Color Flow Doppler were utilized during procedure). Indications:   Stroke I63.9  History:       Patient has no prior history of Echocardiogram examinations.                Stroke.  Sonographer:   Thea Norlander RCS Referring      [phone] CORTNEY E DE LA TORRE Phys: IMPRESSIONS  1. Left ventricular ejection fraction, by estimation, is 65 to 70%. Left ventricular ejection fraction by 2D MOD biplane is 67.5 %. The left ventricle has normal function. There is severe concentric left ventricular hypertrophy. Left ventricular diastolic parameters are consistent with Grade I diastolic dysfunction (impaired relaxation). The average left ventricular global longitudinal strain is -18.1 %. The global longitudinal strain is normal.  2. Right ventricular systolic function is normal. The right ventricular size is normal.  3. The mitral valve is normal in structure. No evidence of mitral valve regurgitation. No evidence of mitral stenosis.  4. The aortic valve is normal in structure. Aortic valve regurgitation is not visualized. No aortic stenosis is present. Comparison(s): No prior Echocardiogram. Would recommend amyloidosis workup given severe LVH and strain pattern. FINDINGS  Left Ventricle: Left ventricular ejection fraction, by estimation, is 65 to 70%. Left ventricular ejection fraction by 2D MOD biplane is 67.5 %. The left ventricle has normal function. The average left ventricular global longitudinal strain is -18.1 %. Strain was performed and the global longitudinal strain is normal. The left ventricular internal cavity size was normal in size. There is severe concentric left ventricular hypertrophy. Left  ventricular diastolic parameters are consistent with Grade I diastolic dysfunction (impaired relaxation). Right Ventricle: The right ventricular size is normal. No increase in right ventricular wall thickness. Right ventricular systolic function is normal. Left Atrium: Left atrial size was normal in size. Right Atrium: Right atrial size was normal in size. Pericardium: There is no  evidence of pericardial effusion. Mitral Valve: The mitral valve is normal in structure. No evidence of mitral valve regurgitation. No evidence of mitral valve stenosis. Tricuspid Valve: The tricuspid valve is normal in structure. Tricuspid valve regurgitation is not demonstrated. No evidence of tricuspid stenosis. Aortic Valve: The aortic valve is normal in structure. Aortic valve regurgitation is not visualized. No aortic stenosis is present. Pulmonic Valve: The pulmonic valve was normal in structure. Pulmonic valve regurgitation is not visualized. No evidence of pulmonic stenosis. Aorta: The aortic root is normal in size and structure. IAS/Shunts: No atrial level shunt detected by color flow Doppler.   LV Volumes (MOD)               Biplane EF (MOD) LV vol d, MOD    147.0 ml      LV Biplane EF:   Left A2C:                                            ventricular LV vol d, MOD    162.0 ml                       ejection A4C:                                            fraction by LV vol s, MOD    41.2 ml                        2D MOD A2C:                                            biplane is LV vol s, MOD    58.7 ml                        67.5 %. A4C: LV SV MOD A2C:   105.8 ml LV SV MOD A4C:   162.0 ml      2D Longitudinal LV SV MOD BP:    110.9 ml      Strain                                2D Strain GLS   -15.0 %                                (A4C):                                2D Strain GLS   -19.1 %                                (A3C):                                2D Strain GLS   -20.0 %                                (  A2C):                                 2D Strain GLS   -18.1 %                                Avg: TRICUSPID VALVE TR Peak grad:   22.1 mmHg TR Vmax:        235.00 cm/s Joelle Cedars Tonleu Electronically signed by Joelle Cedars Tonleu Signature Date/Time: 04/04/2024/1:27:08 PM    Final    MR BRAIN WO CONTRAST Result Date: 04/04/2024 EXAM: MRI BRAIN WITHOUT CONTRAST 04/04/2024 10:18:00 AM TECHNIQUE: Multiplanar multisequence MRI of the head/brain was performed without the administration of intravenous contrast. COMPARISON: Head CT and CTA 04/03/2024. CLINICAL HISTORY: Stroke, follow up. Aphasia. FINDINGS: LIMITATIONS/ARTIFACTS: The examination is intermittently up to moderately motion degraded. BRAIN AND VENTRICLES: There are patchy acute infarcts throughout the left MCA territory, overall moderate in volume. The largest confluent infarct measures 3 cm in the left frontal lobe at and just above the level of the operculum. Additional smaller acute infarcts are scattered throughout cortex and white matter in the MCA territory including in the internal capsule. There is associated cytotoxic edema at the larger infarcts without significant mass effect or associated hemorrhage. Chronic lacunar infarcts are noted in the left internal capsule and left thalamus with chronic blood products associated with the latter. There is a small chronic left cerebellar infarct. No mass, midline shift, hydrocephalus, or extra-axial fluid collection is evident. Overall cerebral volume is within normal limits for age. Major intracranial vascular flow voids are preserved. ORBITS: No acute abnormality. SINUSES AND MASTOIDS: Small bilateral mastoid effusions. Minimal mucosal thickening in the ethmoid sinuses. BONES AND SOFT TISSUES: Normal marrow signal. No acute soft tissue abnormality. IMPRESSION: 1. Patchy acute infarcts throughout the left MCA territory. 2. Chronic lacunar infarcts in the left internal capsule, left thalamus, and cerebellum.  Electronically signed by: Dasie Hamburg MD 04/04/2024 10:58 AM EST RP Workstation: [id number]   CT HEAD CODE STROKE WO CONTRAST Result Date: 04/03/2024 EXAM: CT HEAD WITHOUT CONTRAST 04/03/2024 11:42:28 AM TECHNIQUE: CT of the head was performed without the administration of intravenous contrast. Automated exposure control, iterative reconstruction, and/or weight based adjustment of the mA/kV was utilized to reduce the radiation dose to as low as reasonably achievable. COMPARISON: Head CT 11/29/2006. CLINICAL HISTORY: 43 year old female with acute neuro deficit, stroke suspected. FINDINGS: BRAIN AND VENTRICLES: Brain volume remains normal for age. No acute hemorrhage. No hydrocephalus. No extra-axial collection. No mass effect or midline shift. Abnormal cortical hypodensity left anterior operculum on series 2 image 16. This appears to be a fairly small area of roughly 2 cm involvement. This has an acute or subacute appearance. Asymmetric hypodensity near the genu of the left internal capsule on series 2 image 14 is also new but age indeterminate. Elsewhere in the left MCA territory gray white differentiation appears maintained. Outside of the left MCA territory normal gray white differentiation. No suspicious intracranial vascular hyperdensity. ORBITS: No acute abnormality. SINUSES: Paranasal sinuses, middle ears and mastoids remain well aerated. SOFT TISSUES AND SKULL: No acute soft tissue abnormality. No skull fracture. There is slight rightward gaze. alber[place] stroke program early CT score (aspects) ----- Ganglionic (caudate, ic, lentiform nucleus, insula, M1-m3): 6 Supraganglionic (m4-m6): 2 Total: 8 IMPRESSION: 1. Small acute or subacute cortical infarct in the left anterior operculum (  2 cm), and more subtle age-indeterminate hypodensity near the genu of the left internal capsule. ASPECTS 8. 2. No acute intracranial hemorrhage or mass effect. 3. This was discussed by telephone with Dr Matthews at 1233 hours on  04/03/2024. See also CTA and CTP reported prior to this exam. Electronically signed by: Helayne Hurst MD 04/03/2024 12:36 PM EST RP Workstation: [id number]   CT ANGIO HEAD NECK W WO CM W PERF (CODE STROKE) Result Date: 04/03/2024 EXAM: CT ANGIOGRAPHY OF THE HEAD AND NECK CT PERFUSION BRAIN 09/15/2022 TECHNIQUE: Contiguous axial images were obtained from the base of the skull through the vertex without intravenous contrast. Multidetector CT imaging of the head and neck was performed using the standard protocol during bolus administration of intravenous contrast. 3D postprocessing with multiplanar reconstructions and MIPs was performed to evaluate the vascular anatomy. Carotid stenosis measurements (when applicable) are obtained utilizing NASCET criteria, using the distal internal carotid diameter as the denominator. Cerebral perfusion analysis using computed tomography with contrast administration, including post-processing of parametric maps with determination of cerebral blood flow, cerebral blood volume, mean transit time and time-to-maximum. RADIATION DOSE REDUCTION: This exam was performed according to the departmental dose-optimization program which includes automated exposure control, adjustment of the mA and/or kV according to patient size and/or use of iterative reconstruction technique. CONTRAST: Intravenous contrast was administered. COMPARISON: None Available. CLINICAL HISTORY: 43 year old female, code stroke presentation. FINDINGS: CTA NECK: AORTIC ARCH AND ARCH VESSELS: Normal 3 vessel aortic arch. No dissection or arterial injury. No significant stenosis of the brachiocephalic or subclavian arteries. Proximal right subclavian artery and right vertebral artery V1 and V2 segments appear patent and normal. The right vertebral artery is somewhat dominant. Proximal left subclavian artery soft and calcified plaque without stenosis. Normal left vertebral artery origin. Nondominant left vertebral artery is  diminutive but patent and within limits to the vertebrobasilar junction. Normal left PICA origin. CERVICAL CAROTID ARTERIES: Patent right carotid artery through the right ICA siphon with no significant atherosclerosis or stenosis. Mild tortuosity. Normal right posterior communicating artery origin. Left carotid bifurcation soft and calcified atherosclerotic plaque with no associated stenosis. Patent left ICA with mild tortuosity below the skull base. Left ICA siphon is patent with mild to moderate calcified plaque in the cavernous segment, but no convincing left ICA stenosis. Left posterior communicating artery origin appears normal. No dissection or arterial injury. CERVICAL VERTEBRAL ARTERIES: Right vertebral artery V1 and V2 segments appear patent and normal. The right vertebral artery is somewhat dominant. There is mild to moderate calcified plaque in the right vertebral artery V3 segment with only mild associated stenosis. Right vertebral artery V4 segment and vertebral basilar junction are normal. Normal left vertebral artery origin. Nondominant left vertebral artery is diminutive but patent and within limits to the vertebrobasilar junction. Normal left PICA origin. No dissection or arterial injury. LUNGS AND MEDIASTINUM: Negative visible upper chest. SOFT TISSUES: Nonvascular neck soft tissue spaces are within normal limits. BONES: No acute abnormality. CTA HEAD: ANTERIOR CIRCULATION: No significant stenosis of the internal carotid arteries. Patent MCA and ACA origins. The right ACA A1 segment is dominant, the left is diminutive. The anterior communicating artery is ectatic, without discrete aneurysm. Bilateral ACA branches are within normal limits. Left MCA M1 segment is occluded. No aneurysm. POSTERIOR CIRCULATION: Patent basilar artery without stenosis. SCA origins appear patent. There are fetal type bilateral PCA origins. Bilateral PCA branches are symmetric and within normal limits. Right vertebral  artery V4 segment and vertebral basilar junction are normal. Nondominant left  vertebral artery is diminutive but patent and within limits to the vertebrobasilar junction. No aneurysm. OTHER: No dural venous sinus thrombosis on this non-dedicated study. CT PERFUSION: EXAM QUALITY: Exam quality is adequate with diagnostic perfusion maps. No significant motion artifact. Appropriate arterial inflow and venous outflow curves. CORE INFARCT (CBF<30% volume): 0 mL TOTAL HYPOPERFUSION (Tmax>6s volume): 0 mL PENUMBRA: not applicable Location: not applicable ADDITIONAL PERFUSION FINDINGS: No cerebral blood flow or cerebral blood volume perfusion parameter changes. However, 37 mL of Tmax greater than 4 s volume is detected in the left MCA territory. IMPRESSION: 1. CTA with Left MCA distal M1 occlusion/near occlusion, strongly suggesting ELVO. But with only Tmax >4 seconds parameter abnormality detected on CT Perfusion (37 mL). Plain Head CT images are still pending at this time. 2. Study discussed by telephone with Dr Matthews at 1220 hours on 04/03/2024. 3. Mild to moderate upstream Right carotid (primarily RICA siphon) atherosclerosis. And also calcified plaque in the Right vertebral V3 segment. But no other hemodynamically significant stenosis. Electronically signed by: Helayne Hurst MD 04/03/2024 12:30 PM EST RP Workstation: [id number]      HISTORY OF PRESENT ILLNESS 43 y.o. female with history of  hypertension and polysubstance abuse who presents with severe expressive aphasia. NIH on Admission 5 . Transferred to [facility] due to need for close monitoring of high grade intracranial stenosis  HOSPITAL COURSE Acute Ischemic Infarct:  left MCA, patchy   Etiology:  high grade intracranial stenosis   Code Stroke CT head Small acute or subacute cortical infarct in the left anterior operculum (2cm), and more subtle age-indeterminate hypodensity near the genu of the left internal capsule. ASPECTS 8. CTA head & neck Left MCA distal M1  occlusion/near occlusion.  Mild to moderate upstream Right carotid (primarily RICA siphon) atherosclerosis. And also calcified plaque in the Right vertebral V3 segment. CT perfusion 37 mL of Tmax greater than 4 s volume is detected in the left MCA territory. MRI: 1. Patchy acute infarcts throughout the left MCA territory. 2. Chronic lacunar infarcts in the left internal capsule, left thalamus, and cerebellum. 2D Echo EF 65-70% severe concentric  LVH with Grade I diastolic dysfunction  LDL 111 [id number] 5.2 UDS positive for cocaine and THC  VTE prophylaxis - lovenox   No antithrombotic prior to admission, now on aspirin  81 mg daily and clopidogrel  75 mg daily for 3 months and then ASA alone. Therapy recommendations:  HH PT Disposition:  home 12/5  Hypertension Home meds:  amlodipine  5 mg, continued on discharge  Stable Blood Pressure Goal: normotension   Hyperlipidemia Home meds:  none LDL 111, goal < 70 Add atorvastatin  80mg    Continue statin at discharge   Tobacco Abuse Patient smokes 1/2 packs per day        Ready to quit? Yes Nicotine  replacement therapy prescribed at discharge   Substance Abuse Patient uses THC and cocaine UDS positive for  THC  Cocaine       Ready to quit? Yes TOC consult for cessation placed    Other Stroke Risk Factors ETOH use, alcohol level <15, advised to drink no more than 1 drink(s) a day Congestive heart failure  DISCHARGE EXAM Blood pressure (!) 184/95, pulse 68, temperature 98 F (36.7 C), temperature source Oral, resp. rate 15, SpO2 98%.  PHYSICAL EXAM General:  Alert, well-nourished, well-developed patient in no acute distress Psych:  Mood and affect appropriate for situation CV: Regular rate and rhythm on monitor Respiratory:  Regular, unlabored respirations on room air GI: Abdomen soft  and nontender    NEURO:  Mental Status: AA&Ox self and hospital, time. Able to follow commands.    Cranial Nerves:  II: PERRL. Visual fields full.   III, IV, VI: EOMI. Eyelids elevate symmetrically.  V: Sensation is intact to light touch and symmetrical to face.  VII: Symmetric facial movements VIII: hearing intact to voice. IX, X: Palate elevates symmetrically. Dysarthria improved KP:Dynloizm shrug 5/5. XII: tongue is midline without fasciculations. Motor: 5/5 strength bilateral uppers, and left lower, right lower with drift  Tone: is normal and bulk is normal Sensation- Intact to light touch bilaterally. Extinction absent to light touch to DSS.   Coordination: FTN intact bilaterally, HKS: no ataxia in BLE.No drift.  Mild left over right orbiting Gait- deferred   Discharge NIH : 1   Discharge Diet       Diet   Diet Heart Room service appropriate? Yes with Assist; Fluid consistency: Thin   liquids  DISCHARGE PLAN Disposition:  home aspirin  81 mg daily and clopidogrel  75 mg daily for secondary stroke prevention for 3 months then aspirin  alone. Ongoing stroke risk factor control by Primary Care Physician at time of discharge Continued substance abuse counseling recommended.  Follow-up PCP in 2 weeks. Follow-up in [facility] in 8 weeks, office to schedule an appointment.   35 minutes were spent preparing discharge.   Rocky JAYSON Likes, DNP, AGACNP-BC [facility]   I have personally obtained history,examined this patient, reviewed notes, independently viewed imaging studies, participated in medical decision making and plan of care.ROS completed by me personally and pertinent positives fully documented  I have made any additions or clarifications directly to the above note. Agree with note above.    Eather Popp, MD Medical Director [facility] Pager: [phone] 04/06/2024 4:36 PM

## 2024-04-11 ENCOUNTER — Ambulatory Visit: Admitting: Family Medicine

## 2024-04-11 ENCOUNTER — Encounter: Payer: Self-pay | Admitting: Family Medicine

## 2024-04-11 VITALS — BP 180/115 | HR 85 | Resp 16 | Ht 63.0 in | Wt 179.0 lb

## 2024-04-11 DIAGNOSIS — F32A Depression, unspecified: Secondary | ICD-10-CM | POA: Insufficient documentation

## 2024-04-11 DIAGNOSIS — F17200 Nicotine dependence, unspecified, uncomplicated: Secondary | ICD-10-CM | POA: Diagnosis not present

## 2024-04-11 DIAGNOSIS — F419 Anxiety disorder, unspecified: Secondary | ICD-10-CM

## 2024-04-11 DIAGNOSIS — Z7689 Persons encountering health services in other specified circumstances: Secondary | ICD-10-CM | POA: Diagnosis not present

## 2024-04-11 DIAGNOSIS — F1910 Other psychoactive substance abuse, uncomplicated: Secondary | ICD-10-CM

## 2024-04-11 DIAGNOSIS — Z8673 Personal history of transient ischemic attack (TIA), and cerebral infarction without residual deficits: Secondary | ICD-10-CM | POA: Diagnosis not present

## 2024-04-11 DIAGNOSIS — I10 Essential (primary) hypertension: Secondary | ICD-10-CM

## 2024-04-11 DIAGNOSIS — E782 Mixed hyperlipidemia: Secondary | ICD-10-CM | POA: Diagnosis not present

## 2024-04-11 MED ORDER — CLONIDINE HCL 0.1 MG PO TABS
0.1000 mg | ORAL_TABLET | Freq: Once | ORAL | Status: AC
  Administered 2024-04-11 (×2): 0.1 mg via ORAL

## 2024-04-11 MED ORDER — OLMESARTAN MEDOXOMIL 5 MG PO TABS: 5.0000 mg | ORAL_TABLET | Freq: Every day | ORAL | 3 refills | Status: AC

## 2024-04-11 NOTE — Progress Notes (Signed)
 New Patient Office Visit  Subjective   Patient ID: Sheri Mendoza, female    DOB: 22-Sep-1980  Age: 43 y.o. MRN: [id number]  CC:  Chief Complaint  Patient presents with   Establish Care    Felt like she may have another stroke last night. Numbness in face that felt like stroke but went to bed and feels better. God mother with patient.    Discussed the use of AI scribe software for clinical note transcription with the patient, who gave verbal consent to proceed.  History of Present Illness   Sheri Mendoza is a 43 year old female  who presents to establish with [facility] at [facility]. No previous primary care provider. PMHx: acute ischemic left MCA stroke, tobacco use disorder, substance abuse, HTN, HLD.   She was recently hospitalized for a stroke 12/2-12/5 and has felt generally well since discharge. Her drug screening in the hospital was positive for cocaine and THC, she reports that she has been clean since discharge and is not struggling with cravings. Yesterday, she experienced tingling on her left side, which resolved after she went to bed. Unlike her previous stroke symptoms, she did not experience any speech difficulties during this episode. She has not been checking her blood pressure at home, as she does not have a blood pressure cuff. Denies headaches, vision changes, chest pain, shortness of breath, or swelling in her lower legs. She reports that her left side feels weaker than her usual.   She has a history of hypertension and is currently taking amlodipine . She is on two blood thinners, Plavix  and aspirin , as well as cholesterol medication. She is also using a nicotine  patch, which she finds helpful but struggles with missing smoking. She has not been monitoring her blood pressure at home and does not own a blood pressure cuff.  She reports mild mood struggles over the past two weeks, with no prior history of anxiety or depression. She attributes her mood changes to  recent life changes following her stroke. She has not engaged in counseling before and is interested in outpatient assistance for mood management.  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 She experiences cognitive challenges, such as difficulty writing numbers, which impacts her ability to work. Her neurology appt is not until next spring.         04/11/2024    9:44 AM  GAD 7 : Generalized Anxiety Score  Nervous, Anxious, on Edge 3  Control/stop worrying 3  Worry too much - different things 3  Trouble relaxing 1  Restless 0  Easily annoyed or irritable 0  Afraid - awful might happen 3  Total GAD 7 Score 13  Anxiety Difficulty Somewhat difficult      04/11/2024    9:44 AM  PHQ9 SCORE ONLY  PHQ-9 Total Score 13    Outpatient Encounter Medications as of 04/11/2024  Medication Sig   amLODipine  (NORVASC ) 5 MG tablet Take 1 tablet (5 mg total) by mouth daily.   aspirin  81 MG chewable tablet Chew 1 tablet (81 mg total) by mouth daily.   atorvastatin  (LIPITOR ) 80 MG tablet Take 1 tablet (80 mg total) by mouth daily.   clopidogrel  (PLAVIX ) 75 MG tablet Take 1 tablet (75 mg total) by mouth daily.   nicotine  (NICODERM CQ  - DOSED IN MG/24 HOURS) 14 mg/24hr patch Place 1 patch (14 mg total) onto the skin daily.   olmesartan (  BENICAR) 5 MG tablet Take 1 tablet (5 mg total) by mouth daily.   [EXPIRED] cloNIDine (CATAPRES) tablet 0.1 mg    No facility-administered encounter medications on file as of 04/11/2024.    Patient Active Problem List   Diagnosis Date Noted   History of ischemic left MCA stroke 04/11/2024   Anxiety and depression 04/11/2024   Tobacco use disorder 04/06/2024   Substance abuse (HCC) 04/06/2024   HTN (hypertension) 04/06/2024   HLD (hyperlipidemia) 04/06/2024   Acute ischemic left MCA stroke (HCC) 04/03/2024   Past Medical History:  Diagnosis Date   Hypertension    Past Surgical History:  Procedure  Laterality Date   TONSILLECTOMY     History reviewed. No pertinent family history. Social History   Socioeconomic History   Marital status: Single    Spouse name: Not on file   Number of children: Not on file   Years of education: Not on file   Highest education level: Not on file  Occupational History   Not on file  Tobacco Use   Smoking status: Former    Types: Cigarettes    Passive exposure: Never   Smokeless tobacco: Never  Vaping Use   Vaping status: Former  Substance and Sexual Activity   Alcohol use: Not Currently   Drug use: Yes    Types: Cocaine    Comment: Noted in ED notes12/2025.   Sexual activity: Not Currently  Other Topics Concern   Not on file  Social History Narrative   Not on file   Social Drivers of Health   Financial Resource Strain: Not on file  Food Insecurity: No Food Insecurity (04/06/2024)   Hunger Vital Sign    Worried About Running Out of Food in the Last Year: Never true    Ran Out of Food in the Last Year: Never true  Transportation Needs: No Transportation Needs (04/06/2024)   PRAPARE - Administrator, Civil Service (Medical): No    Lack of Transportation (Non-Medical): No  Physical Activity: Not on file  Stress: Not on file  Social Connections: Not on file  Intimate Partner Violence: Not At Risk (04/06/2024)   Humiliation, Afraid, Rape, and Kick questionnaire    Fear of Current or Ex-Partner: No    Emotionally Abused: No    Physically Abused: No    Sexually Abused: No   Outpatient Medications Prior to Visit  Medication Sig Dispense Refill   amLODipine  (NORVASC ) 5 MG tablet Take 1 tablet (5 mg total) by mouth daily. 30 tablet 0   aspirin  81 MG chewable tablet Chew 1 tablet (81 mg total) by mouth daily. 90 tablet 0   atorvastatin  (LIPITOR ) 80 MG tablet Take 1 tablet (80 mg total) by mouth daily. 60 tablet 0   clopidogrel  (PLAVIX ) 75 MG tablet Take 1 tablet (75 mg total) by mouth daily. 90 tablet 0   nicotine  (NICODERM CQ   - DOSED IN MG/24 HOURS) 14 mg/24hr patch Place 1 patch (14 mg total) onto the skin daily. 28 patch 0   No facility-administered medications prior to visit.   No Known Allergies  ROS: see HPI    Objective   Today's Vitals   04/11/24 0919 04/11/24 1012 04/11/24 1025 04/11/24 1045  BP: (!) 185/131 (!) 174/123 (!) 173/107 (!) 180/115  Pulse: 85     Resp: 16     SpO2: 98%     Weight: 179 lb (81.2 kg)     Height: 5' 3 (1.6 m)  PainSc: 0-No pain      GENERAL: Well-appearing, in NAD. Well nourished.  SKIN: Pink, warm and dry. No rash, lesion, ulceration, or ecchymoses.  Head: Normocephalic. NECK: Trachea midline. Full ROM w/o pain or tenderness. No lymphadenopathy.  EARS: Tympanic membranes are intact, translucent without bulging and without drainage. Appropriate landmarks visualized.  EYES: Conjunctiva clear without exudates. EOMI, PERRL, no drainage present.  NOSE: Septum midline w/o deformity. Nares patent, mucosa pink and non-inflamed w/o drainage. No sinus tenderness.  THROAT: Uvula midline. Oropharynx clear. Tonsils non-inflamed without exudate. Mucous membranes pink and moist.  RESPIRATORY: Chest wall symmetrical. Respirations even and non-labored. Breath sounds clear to auscultation bilaterally.  CARDIAC: S1, S2 present, regular rate and rhythm without murmur or gallops. Peripheral pulses 2+ bilaterally.  MSK: Muscle tone and strength appropriate for age. Joints w/o tenderness, redness, or swelling.  EXTREMITIES: Without clubbing, cyanosis, or edema.  NEUROLOGIC: No motor or sensory deficits. Steady, even gait. C2-C12 intact.  PSYCH/MENTAL STATUS: Alert, oriented x 3. Cooperative, appropriate mood and affect.     Assessment & Plan:   1. Encounter to establish care (Primary) Patient is a 23- year-old female who presents today to establish care with primary care at [facility]. Reviewed the past medical history, family history, social history, surgical history, medications and  allergies today- updates made as indicated. She is here to establish care after a recent hospitalization for an ischemic left MCA stroke.   2. Tobacco use disorder Currently using nicotine  patch, finds it helpful but misses action of smoking. Discussed increasing dose if needed. Consider increasing nicotine  patch to 21 mg if current dose is insufficient. Patient reports she will continue with her current prescription.   3. Substance abuse (HCC) Positive drug screen in hospital records. Discussed self-medication risks. Consider outpatient assistance or counseling if needed. She will consider at this time.   4. Primary hypertension Patient presents today with very elevated blood pressure- initial blood pressure 185/131. Repeat blood pressure 174/123 and was given 0.1mg  clonidine in office. Second repeat blood pressure was 175/107 and was given another 0.1mg  clonidine in office. Repeat 180/115 and discussed strict precautions of when to seek emergency care. Patient in no acute distress and is well-appearing. Denies chest pain, shortness of breath, lower extremity edema, vision changes, headaches. No neurological deficits present on exam. Cardiovascular exam with heart regular rate and rhythm. Normal heart sounds, no murmurs present. No lower extremity edema present. Lungs clear to auscultation bilaterally. Patient is currently taking amlodipine  5mg  daily. Decision made to add olmesartan. Discussed side effects and monitoring needs. Added olmesartan 5 mg daily. Recheck kidney function and potassium in two weeks. Advised home blood pressure monitoring 30-60 minutes post-medication. Advised patient to closely monitor blood pressure and when to seek emergency care. Advised her to take olmesartan medication as soon as she picks it up from the pharmacy. Follow-up in 2 weeks.     5. Mixed hyperlipidemia Currently on cholesterol medication. Advised patient to continue atorvastatin  80mg  daily.   6. History of  ischemic left MCA stroke History of ischemic left MCA stroke with residual cognitive deficit. Recent transient left-sided tingling last night, similar to previous stroke symptoms. However, patient did not go to the hospital. Residual cognitive deficits persist, such as writing. Advised to return to hospital if numbness in face or tingling occurs to prevent further complications and even death.   7. Anxiety and depression GAD7 completed with score of 13. Denies issues with panic attacks, shortness of breath, difficulty breathing, palpitations, hyperventilation, and  dizziness. PHQ9 completed with score of 13. Denies active or passive suicidal ideations. Discussed benefits of cognitive behavioral therapy (CBT) and first-line pharmacotherapy concurrently. She thinks it is related to recent her recent hospitalization and life changes. She would like to monitor symptoms and consider mood medication symptoms persist or worsen.    Return in about 12 days (around 04/23/2024) for HTN follow-up.   Evalene Arts, FNP

## 2024-04-11 NOTE — Patient Instructions (Addendum)
 Smoking Cessation: Quitline[facility] 1-800-QUIT-NOW (802)347-4040); Espaol: 1-855-Djelo-Ya (8-144-664-6430) http://carroll-castaneda.info/      Please check your blood pressure 30-60 minutes after taking your blood pressure medication.  If you are unsure of the accuracy, you can bring the cuff in to your next appointment to compare to our machine.

## 2024-04-24 ENCOUNTER — Ambulatory Visit: Attending: Neurology | Admitting: Physical Therapy

## 2024-04-30 ENCOUNTER — Ambulatory Visit: Admitting: Physical Therapy

## 2024-05-01 ENCOUNTER — Ambulatory Visit: Admitting: Family Medicine

## 2024-05-01 NOTE — Progress Notes (Deleted)
" ° °  Established Patient Office Visit  Subjective  Patient ID: Sheri Mendoza, female    DOB: 1981/03/31  Age: 43 y.o. MRN: [id number]  No chief complaint on file.  HYPERTENSION: Sheri Mendoza presents for the medical management of hypertension.  Patient's current hypertension medication regimen is: amlodipine  5mg  daily & olmesartan  5mg  daily  Patient is currently taking prescribed medications for HTN.  Patient is *** regularly keeping a check on BP at home.  Adhering to low sodium diet: *** Exercising Regularly: *** Denies headache, dizziness, CP, SHOB, vision changes.    BP Readings from Last 3 Encounters:  04/11/24 (!) 180/115  04/06/24 (!) 184/95  04/03/24 (!) 212/106   ROS: see HPI    Objective:     There were no vitals taken for this visit. BP Readings from Last 3 Encounters:  04/11/24 (!) 180/115  04/06/24 (!) 184/95  04/03/24 (!) 212/106     Physical Exam    Assessment & Plan:  There are no diagnoses linked to this encounter.   No follow-ups on file.    Evalene Arts, FNP "

## 2024-05-02 ENCOUNTER — Ambulatory Visit: Admitting: Physical Therapy

## 2024-05-08 ENCOUNTER — Ambulatory Visit: Admitting: Physical Therapy

## 2024-05-10 ENCOUNTER — Ambulatory Visit: Admitting: Physical Therapy

## 2024-05-15 ENCOUNTER — Ambulatory Visit: Admitting: Physical Therapy

## 2024-05-17 ENCOUNTER — Ambulatory Visit: Admitting: Physical Therapy

## 2024-05-22 ENCOUNTER — Ambulatory Visit: Admitting: Physical Therapy

## 2024-05-24 ENCOUNTER — Ambulatory Visit: Admitting: Physical Therapy

## 2024-05-29 ENCOUNTER — Ambulatory Visit: Admitting: Physical Therapy

## 2024-05-31 ENCOUNTER — Ambulatory Visit: Admitting: Physical Therapy

## 2024-06-05 ENCOUNTER — Ambulatory Visit: Admitting: Physical Therapy

## 2024-06-07 ENCOUNTER — Ambulatory Visit: Admitting: Physical Therapy

## 2024-06-12 ENCOUNTER — Ambulatory Visit: Admitting: Physical Therapy

## 2024-06-14 ENCOUNTER — Ambulatory Visit: Admitting: Physical Therapy
